# Patient Record
Sex: Female | Born: 1995
Health system: Southern US, Community
[De-identification: ages and names within clinical notes are randomized; demographics above are authoritative.]

## PROBLEM LIST (undated history)

## (undated) DIAGNOSIS — S42462A Displaced fracture of medial condyle of left humerus, initial encounter for closed fracture: Secondary | ICD-10-CM

---
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
## 2015-02-18 ENCOUNTER — Emergency Department (HOSPITAL_COMMUNITY): Payer: BLUE CROSS/BLUE SHIELD

## 2015-02-18 ENCOUNTER — Encounter (HOSPITAL_COMMUNITY): Payer: Self-pay

## 2015-02-18 ENCOUNTER — Emergency Department (INDEPENDENT_AMBULATORY_CARE_PROVIDER_SITE_OTHER): Payer: BLUE CROSS/BLUE SHIELD

## 2015-02-18 ENCOUNTER — Emergency Department (HOSPITAL_COMMUNITY)
Admission: EM | Admit: 2015-02-18 | Discharge: 2015-02-18 | Disposition: A | Discharge status: Home / Self Care | Payer: BLUE CROSS/BLUE SHIELD | Source: Home / Self Care | Attending: Family Medicine | Admitting: Family Medicine

## 2015-02-18 DIAGNOSIS — W19XXXA Unspecified fall, initial encounter: Secondary | ICD-10-CM

## 2015-02-18 DIAGNOSIS — S42442A Displaced fracture (avulsion) of medial epicondyle of left humerus, initial encounter for closed fracture: Secondary | ICD-10-CM

## 2015-02-18 DIAGNOSIS — S42462A Displaced fracture of medial condyle of left humerus, initial encounter for closed fracture: Secondary | ICD-10-CM

## 2015-02-18 HISTORY — DX: Displaced fracture of medial condyle of left humerus, initial encounter for closed fracture: S42.462A

## 2015-02-18 MED ORDER — IBUPROFEN 800 MG PO TABS
ORAL_TABLET | ORAL | Status: AC
  Filled 2015-02-18: qty 1

## 2015-02-18 MED ORDER — IBUPROFEN 800 MG PO TABS
800.0000 mg | ORAL_TABLET | Freq: Once | ORAL | Status: AC
  Administered 2015-02-18: 800 mg via ORAL

## 2015-02-18 NOTE — ED Notes (Addendum)
States she fell last PM, and after her fall, she "popped her elbow back into place". Ecchymosis noted, good ROM /sensation in fingers. Patient retained rings

## 2015-02-18 NOTE — ED Notes (Signed)
Ortho tech paged  

## 2015-02-18 NOTE — Progress Notes (Signed)
Orthopedic Tech Progress Note Patient Details:  Elizabeth CollumOlivia G Daugherty Jul 13, 1996 [id number]  Ortho Devices Type of Ortho Device: Ace wrap, Arm sling, Long arm splint Ortho Device/Splint Location: lue Ortho Device/Splint Interventions: Application Viewed order from doctor's order list  Nikki DomCrawford, [name] 02/18/2015, 11:20 AM

## 2015-02-18 NOTE — ED Provider Notes (Signed)
CSN: [id number]     Arrival date & time 02/18/15  0803 History   First MD Initiated Contact with Patient 02/18/15 [phone]     Chief Complaint  Patient presents with  . Fall   (Consider location/radiation/quality/duration/timing/severity/associated sxs/prior Treatment) HPI  Larey Seat yesterday while taking a self feet. Patient reports falling off the back of a couch hitting another couch and on the floor. Arm initially floppy but pt reports pulling on arm and putting it back in place. 200-400mg  ibuprofen and ice w/o much relief. I will became immediately tender and swollen pain and swelling are constant and getting worse. Decreased range of motion secondary to pain. Denies chest pain, palpitations, shortness of breath, headache, syncope, head trauma, abdominal pain, dysuria, frequency, loss of function of sensation or strength in left hand.   Past Medical History  Diagnosis Date  . Depression    Past Surgical History  Procedure Laterality Date  . Tonsillectomy    . Stitches on her head     History reviewed. No pertinent family history. History  Substance Use Topics  . Smoking status: Current Every Day Smoker -- 0.50 packs/day    Types: Cigarettes  . Smokeless tobacco: Never Used  . Alcohol Use: No   OB History    No data available     Review of Systems Per HPI with all other pertinent systems negative.   Allergies  Review of patient's allergies indicates no known allergies.  Home Medications   Prior to Admission medications   Medication Sig Start Date End Date Taking? Authorizing Provider  Norethindrone Acet-Ethinyl Est (JUNEL 1.5/30 PO) Take 1 tablet by mouth daily.   Yes Historical Provider, MD   BP 124/86 mmHg  Pulse 82  Temp(Src) 98.2 F (36.8 C) (Oral)  Resp 12  SpO2 98%  LMP 02/16/2015 (Exact Date) Physical Exam Physical Exam  Constitutional: oriented to person, place, and time. appears well-developed and well-nourished. No distress.  HENT:  Head: Normocephalic  and atraumatic.  Eyes: EOMI. PERRL.  Neck: Normal range of motion.  Cardiovascular: RRR, no m/r/g, 2+ distal pulses,  Pulmonary/Chest: Effort normal and breath sounds normal. No respiratory distress.  Abdominal: Soft. Bowel sounds are normal. NonTTP, no distension.  Musculoskeletal: Limited range of motion of left elbow secondary to pain and swelling. Left elbow with diffuse swelling and medial tenderness to palpation. Distal left upper extremity pulses intact. Neurological: alert and oriented to person, place, and time.  Skin: Skin is warm. No rash noted. non diaphoretic.  Psychiatric: normal mood and affect. behavior is normal. Judgment and thought content normal.   ED Course  Procedures (including critical care time) Labs Review Labs Reviewed - No data to display  Imaging Review Dg Elbow Complete Left  02/18/2015   CLINICAL DATA:  Patient fell onto elbow  EXAM: LEFT ELBOW - COMPLETE 3+ VIEW  COMPARISON:  None.  FINDINGS: Frontal, lateral, and bilateral oblique views were obtained. There is a displaced fracture of the distal medial humeral condyle. There is soft tissue swelling but no appreciable effusion. No dislocation. There is a amorphous calcification adjacent to the lateral distal humeral condyle, likely of arthropathic etiology.  IMPRESSION: Displaced fracture medial distal humeral condyle. Ill-defined calcification adjacent to the lateral distal humeral condyle of uncertain etiology. No dislocation.   Electronically Signed   By: Bretta Bang III M.D.   On: 02/18/2015 09:05     MDM   1. Displaced fracture of medial epicondyle of humerus, left, closed, initial encounter   2. Fall,  initial encounter    Discussed case with Dr. Carola FrostHandy of orthopedic surgery, who is graciously agreed to see the patient in his clinic today. Patient placed in a posterior splint at 90 and fitted with a sling. Motrin 800 mg by mouth given. Ice pack provided. Images of the patient's elbow provided.  Patient likely to need surgery to repair.  Precautions given and all questions answered   Shelly Flattenavid Taahir Grisby, MD Family Medicine 02/18/2015, 10:32 AM       Ozella Rocksavid J Jaaliyah Lucatero, MD 02/18/15 [phone]

## 2015-02-18 NOTE — ED Notes (Signed)
Family at bedside. 

## 2015-02-18 NOTE — ED Notes (Signed)
Discussed delay

## 2015-02-18 NOTE — ED Notes (Signed)
Patient is resting comfortably. 

## 2015-02-18 NOTE — Discharge Instructions (Signed)
You broke your elbow. Specifically you had caused a displaced fracture of the medial humeral epicondyle. I spoke to Dr Carola FrostHandy of orthopedic surgery and he wants you to call his office today to see him in the office. Please use ibuprofen 600 mg every 6 hours or 800 mg every 8 hours for pain and inflammation. Please ice your elbow regularly for the next 24 hours. You will likely need surgery to repair your elbow.

## 2015-02-19 ENCOUNTER — Other Ambulatory Visit: Payer: Self-pay | Admitting: Orthopedic Surgery

## 2015-02-19 ENCOUNTER — Ambulatory Visit
Admission: RE | Admit: 2015-02-19 | Discharge: 2015-02-19 | Disposition: A | Discharge status: Home / Self Care | Payer: BLUE CROSS/BLUE SHIELD | Source: Ambulatory Visit | Attending: Orthopedic Surgery | Admitting: Orthopedic Surgery

## 2015-02-19 DIAGNOSIS — S42442A Displaced fracture (avulsion) of medial epicondyle of left humerus, initial encounter for closed fracture: Secondary | ICD-10-CM

## 2015-02-23 ENCOUNTER — Other Ambulatory Visit: Payer: Self-pay | Admitting: Orthopedic Surgery

## 2015-02-24 ENCOUNTER — Encounter (HOSPITAL_BASED_OUTPATIENT_CLINIC_OR_DEPARTMENT_OTHER): Payer: Self-pay | Admitting: *Deleted

## 2015-03-02 ENCOUNTER — Encounter (HOSPITAL_BASED_OUTPATIENT_CLINIC_OR_DEPARTMENT_OTHER): Payer: Self-pay | Admitting: Anesthesiology

## 2015-03-02 ENCOUNTER — Ambulatory Visit (HOSPITAL_BASED_OUTPATIENT_CLINIC_OR_DEPARTMENT_OTHER): Payer: BLUE CROSS/BLUE SHIELD | Admitting: Certified Registered"

## 2015-03-02 ENCOUNTER — Encounter (HOSPITAL_BASED_OUTPATIENT_CLINIC_OR_DEPARTMENT_OTHER)
Admission: RE | Disposition: A | Discharge status: Home / Self Care | Payer: Self-pay | Source: Ambulatory Visit | Attending: Orthopedic Surgery

## 2015-03-02 ENCOUNTER — Ambulatory Visit (HOSPITAL_BASED_OUTPATIENT_CLINIC_OR_DEPARTMENT_OTHER)
Admission: RE | Admit: 2015-03-02 | Discharge: 2015-03-02 | Disposition: A | Discharge status: Home / Self Care | Payer: BLUE CROSS/BLUE SHIELD | Source: Ambulatory Visit | Attending: Orthopedic Surgery | Admitting: Orthopedic Surgery

## 2015-03-02 DIAGNOSIS — W07XXXA Fall from chair, initial encounter: Secondary | ICD-10-CM | POA: Diagnosis not present

## 2015-03-02 DIAGNOSIS — S42442A Displaced fracture (avulsion) of medial epicondyle of left humerus, initial encounter for closed fracture: Secondary | ICD-10-CM | POA: Diagnosis not present

## 2015-03-02 DIAGNOSIS — F1099 Alcohol use, unspecified with unspecified alcohol-induced disorder: Secondary | ICD-10-CM | POA: Diagnosis not present

## 2015-03-02 DIAGNOSIS — F17210 Nicotine dependence, cigarettes, uncomplicated: Secondary | ICD-10-CM | POA: Diagnosis not present

## 2015-03-02 HISTORY — DX: Displaced fracture of medial condyle of left humerus, initial encounter for closed fracture: S42.462A

## 2015-03-02 HISTORY — PX: ORIF ELBOW FRACTURE: SHX5031

## 2015-03-02 SURGERY — OPEN REDUCTION INTERNAL FIXATION (ORIF) ELBOW/OLECRANON FRACTURE
Anesthesia: General | Site: Elbow | Laterality: Left

## 2015-03-02 MED ORDER — CEFAZOLIN SODIUM-DEXTROSE 2-3 GM-% IV SOLR
2.0000 g | INTRAVENOUS | Status: AC
  Administered 2015-03-02: 2 g via INTRAVENOUS

## 2015-03-02 MED ORDER — IBUPROFEN 200 MG PO TABS: 200.0000 mg | ORAL_TABLET | Freq: Four times a day (QID) | ORAL | Status: DC | PRN

## 2015-03-02 MED ORDER — FENTANYL CITRATE (PF) 100 MCG/2ML IJ SOLN
INTRAMUSCULAR | Status: AC
Start: 2015-03-02 — End: 2015-03-02
  Filled 2015-03-02: qty 2

## 2015-03-02 MED ORDER — DEXAMETHASONE SODIUM PHOSPHATE 10 MG/ML IJ SOLN
INTRAMUSCULAR | Status: DC | PRN
  Administered 2015-03-02: 10 mg via INTRAVENOUS

## 2015-03-02 MED ORDER — LACTATED RINGERS IV SOLN
INTRAVENOUS | Status: DC
  Administered 2015-03-02 (×2): via INTRAVENOUS

## 2015-03-02 MED ORDER — FENTANYL CITRATE (PF) 100 MCG/2ML IJ SOLN
INTRAMUSCULAR | Status: AC
  Filled 2015-03-02: qty 6

## 2015-03-02 MED ORDER — LIDOCAINE HCL (CARDIAC) 20 MG/ML IV SOLN
INTRAVENOUS | Status: DC | PRN
  Administered 2015-03-02: 50 mg via INTRAVENOUS

## 2015-03-02 MED ORDER — DOCUSATE SODIUM 100 MG PO CAPS: 100.0000 mg | ORAL_CAPSULE | Freq: Three times a day (TID) | ORAL | Status: DC | PRN

## 2015-03-02 MED ORDER — MIDAZOLAM HCL 2 MG/ML PO SYRP: 12.0000 mg | ORAL_SOLUTION | Freq: Once | ORAL | Status: DC | PRN

## 2015-03-02 MED ORDER — ONDANSETRON HCL 4 MG/2ML IJ SOLN
INTRAMUSCULAR | Status: DC | PRN
  Administered 2015-03-02: 4 mg via INTRAVENOUS

## 2015-03-02 MED ORDER — POVIDONE-IODINE 7.5 % EX SOLN: Freq: Once | CUTANEOUS | Status: DC

## 2015-03-02 MED ORDER — OXYCODONE HCL 5 MG PO TABS
5.0000 mg | ORAL_TABLET | Freq: Once | ORAL | Status: AC
  Administered 2015-03-02: 5 mg via ORAL

## 2015-03-02 MED ORDER — MIDAZOLAM HCL 2 MG/2ML IJ SOLN
INTRAMUSCULAR | Status: AC
  Filled 2015-03-02: qty 2

## 2015-03-02 MED ORDER — OXYCODONE HCL 5 MG PO TABS
ORAL_TABLET | ORAL | Status: AC
  Filled 2015-03-02: qty 1

## 2015-03-02 MED ORDER — PROPOFOL 10 MG/ML IV BOLUS
INTRAVENOUS | Status: DC | PRN
  Administered 2015-03-02: 200 mg via INTRAVENOUS

## 2015-03-02 MED ORDER — CEFAZOLIN SODIUM-DEXTROSE 2-3 GM-% IV SOLR
INTRAVENOUS | Status: AC
  Filled 2015-03-02: qty 50

## 2015-03-02 MED ORDER — KETOROLAC TROMETHAMINE 30 MG/ML IJ SOLN
30.0000 mg | Freq: Once | INTRAMUSCULAR | Status: DC | PRN
Start: 2015-03-02 — End: 2015-03-02

## 2015-03-02 MED ORDER — MIDAZOLAM HCL 2 MG/2ML IJ SOLN
1.0000 mg | INTRAMUSCULAR | Status: DC | PRN
  Administered 2015-03-02: 2 mg via INTRAVENOUS

## 2015-03-02 MED ORDER — IBUPROFEN 100 MG/5ML PO SUSP
200.0000 mg | Freq: Four times a day (QID) | ORAL | Status: DC | PRN
Start: 2015-03-02 — End: 2015-03-02

## 2015-03-02 MED ORDER — HYDROMORPHONE HCL 1 MG/ML IJ SOLN: 0.2500 mg | INTRAMUSCULAR | Status: DC | PRN

## 2015-03-02 MED ORDER — OXYCODONE-ACETAMINOPHEN 5-325 MG PO TABS: 1.0000 | ORAL_TABLET | ORAL | Status: DC | PRN

## 2015-03-02 MED ORDER — BUPIVACAINE-EPINEPHRINE (PF) 0.5% -1:200000 IJ SOLN
INTRAMUSCULAR | Status: DC | PRN
  Administered 2015-03-02: 25 mL via PERINEURAL

## 2015-03-02 MED ORDER — MEPERIDINE HCL 25 MG/ML IJ SOLN: 6.2500 mg | INTRAMUSCULAR | Status: DC | PRN

## 2015-03-02 MED ORDER — FENTANYL CITRATE 0.05 MG/ML IJ SOLN
50.0000 ug | INTRAMUSCULAR | Status: DC | PRN
  Administered 2015-03-02: 100 ug via INTRAVENOUS
  Administered 2015-03-02 (×2): 25 ug via INTRAVENOUS
  Administered 2015-03-02: 50 ug via INTRAVENOUS

## 2015-03-02 SURGICAL SUPPLY — 67 items
BANDAGE ELASTIC 4 VELCRO ST LF (GAUZE/BANDAGES/DRESSINGS) ×3 IMPLANT
BLADE SURG 15 STRL LF DISP TIS (BLADE) ×1 IMPLANT
BLADE SURG 15 STRL SS (BLADE) ×2
BNDG COHESIVE 4X5 TAN STRL (GAUZE/BANDAGES/DRESSINGS) ×3 IMPLANT
BNDG ESMARK 4X9 LF (GAUZE/BANDAGES/DRESSINGS) ×3 IMPLANT
BNDG GAUZE ELAST 4 BULKY (GAUZE/BANDAGES/DRESSINGS) ×3 IMPLANT
CANISTER SUCT 1200ML W/VALVE (MISCELLANEOUS) ×3 IMPLANT
CHLORAPREP W/TINT 26ML (MISCELLANEOUS) ×3 IMPLANT
CORDS BIPOLAR (ELECTRODE) IMPLANT
COVER BACK TABLE 60X90IN (DRAPES) ×3 IMPLANT
COVER MAYO STAND STRL (DRAPES) ×3 IMPLANT
CUFF TOURN SGL LL 18 NRW (TOURNIQUET CUFF) ×3 IMPLANT
DECANTER SPIKE VIAL GLASS SM (MISCELLANEOUS) IMPLANT
DRAPE EXTREMITY T 121X128X90 (DRAPE) ×3 IMPLANT
DRAPE SURG 17X23 STRL (DRAPES) ×3 IMPLANT
DRAPE U 20/CS (DRAPES) ×3 IMPLANT
DRAPE U-SHAPE 47X51 STRL (DRAPES) ×3 IMPLANT
DRSG EMULSION OIL 3X3 NADH (GAUZE/BANDAGES/DRESSINGS) ×3 IMPLANT
ELECT REM PT RETURN 9FT ADLT (ELECTROSURGICAL) ×3
ELECTRODE REM PT RTRN 9FT ADLT (ELECTROSURGICAL) ×1 IMPLANT
GAUZE SPONGE 4X4 12PLY STRL (GAUZE/BANDAGES/DRESSINGS) ×3 IMPLANT
GLOVE BIO SURGEON STRL SZ7 (GLOVE) ×3 IMPLANT
GLOVE BIOGEL PI IND STRL 7.0 (GLOVE) ×1 IMPLANT
GLOVE BIOGEL PI IND STRL 8 (GLOVE) ×1 IMPLANT
GLOVE BIOGEL PI INDICATOR 7.0 (GLOVE) ×2
GLOVE BIOGEL PI INDICATOR 8 (GLOVE) ×2
GLOVE SURG SS PI 7.0 STRL IVOR (GLOVE) ×3 IMPLANT
GOWN STRL REUS W/ TWL LRG LVL3 (GOWN DISPOSABLE) ×2 IMPLANT
GOWN STRL REUS W/ TWL XL LVL3 (GOWN DISPOSABLE) ×1 IMPLANT
GOWN STRL REUS W/TWL LRG LVL3 (GOWN DISPOSABLE) ×4
GOWN STRL REUS W/TWL XL LVL3 (GOWN DISPOSABLE) ×2
K-WIRE ACE 1.6X6 (WIRE) ×3
KWIRE ACE 1.6X6 (WIRE) ×1 IMPLANT
NEEDLE HYPO 25X1 1.5 SAFETY (NEEDLE) IMPLANT
NS IRRIG 1000ML POUR BTL (IV SOLUTION) ×3 IMPLANT
PACK BASIN DAY SURGERY FS (CUSTOM PROCEDURE TRAY) ×3 IMPLANT
PAD CAST 4YDX4 CTTN HI CHSV (CAST SUPPLIES) ×1 IMPLANT
PADDING CAST ABS 4INX4YD NS (CAST SUPPLIES) ×2
PADDING CAST ABS COTTON 4X4 ST (CAST SUPPLIES) ×1 IMPLANT
PADDING CAST COTTON 4X4 STRL (CAST SUPPLIES) ×2
PENCIL BUTTON HOLSTER BLD 10FT (ELECTRODE) ×3 IMPLANT
PLATE SPIDER 16 (Washer) ×3 IMPLANT
SCREW ACE CAN 4.0 46M (Screw) ×3 IMPLANT
SLING ARM FOAM STRAP LRG (SOFTGOODS) ×3 IMPLANT
SPLINT FAST PLASTER 5X30 (CAST SUPPLIES)
SPLINT PLASTER CAST FAST 5X30 (CAST SUPPLIES) IMPLANT
SPLINT PLASTER CAST XFAST 3X15 (CAST SUPPLIES) IMPLANT
SPLINT PLASTER CAST XFAST 4X15 (CAST SUPPLIES) IMPLANT
SPLINT PLASTER XTRA FAST SET 4 (CAST SUPPLIES)
SPLINT PLASTER XTRA FASTSET 3X (CAST SUPPLIES)
SPONGE LAP 4X18 X RAY DECT (DISPOSABLE) ×6 IMPLANT
STOCKINETTE 4X48 STRL (DRAPES) ×3 IMPLANT
SUCTION FRAZIER TIP 10 FR DISP (SUCTIONS) ×3 IMPLANT
SUT ETHILON 4 0 PS 2 18 (SUTURE) ×3 IMPLANT
SUT MNCRL AB 4-0 PS2 18 (SUTURE) ×3 IMPLANT
SUT TIGER TAPE 7 IN WHITE (SUTURE) IMPLANT
SUT VIC AB 2-0 SH 27 (SUTURE) ×2
SUT VIC AB 2-0 SH 27XBRD (SUTURE) ×1 IMPLANT
SYR BULB 3OZ (MISCELLANEOUS) ×3 IMPLANT
SYR CONTROL 10ML LL (SYRINGE) IMPLANT
TAPE FIBER 2MM 7IN #2 BLUE (SUTURE) IMPLANT
TAPE STRIPS DRAPE STRL (GAUZE/BANDAGES/DRESSINGS) ×3 IMPLANT
TOWEL OR 17X24 6PK STRL BLUE (TOWEL DISPOSABLE) ×6 IMPLANT
TOWEL OR NON WOVEN STRL DISP B (DISPOSABLE) ×3 IMPLANT
TUBE CONNECTING 20'X1/4 (TUBING) ×1
TUBE CONNECTING 20X1/4 (TUBING) ×2 IMPLANT
UNDERPAD 30X30 INCONTINENT (UNDERPADS AND DIAPERS) ×3 IMPLANT

## 2015-03-02 NOTE — Progress Notes (Signed)
Assisted Dr. Crews with left, ultrasound guided, interscalene  block. Side rails up, monitors on throughout procedure. See vital signs in flow sheet. Tolerated Procedure well. 

## 2015-03-02 NOTE — Discharge Instructions (Signed)
Discharge Instructions after Open Elbow Surgery   A sling may be provided for you. If so, remain in your sling at all times. This includes sleeping in the sling.  Use ice on the elbow intermittently over the first 48 hours after surgery. Pain medicine has been prescribed for you.  Use your medicine liberally over the first 48 hours, and then you can begin to taper your use. You may take Extra Strength Tylenol or Tylenol only in place of the pain pills.  Leave the dressing in place until your follow-up appointment  You may shower after surgery. The dressing CANNOT get wet during your shower. Wrap the dressing with a dry towel and place your arm in a large trash bag.  Take one aspirin a day for 2 weeks after surgery, unless you have an aspirin sensitivity/ allergy or asthma.   Please call [phone] during normal business hours or [phone] after hours for any problems. Including the following:  - excessive redness of the incisions - drainage for more than 4 days - fever of more than 101.5 F  *Please note that pain medications will not be refilled after hours or on weekends.      Post Anesthesia Home Care Instructions  Activity: Get plenty of rest for the remainder of the day. A responsible adult should stay with you for 24 hours following the procedure.  For the next 24 hours, DO NOT: -Drive a car -Advertising copywriter -Drink alcoholic beverages -Take any medication unless instructed by your physician -Make any legal decisions or sign important papers.  Meals: Start with liquid foods such as gelatin or soup. Progress to regular foods as tolerated. Avoid greasy, spicy, heavy foods. If nausea and/or vomiting occur, drink only clear liquids until the nausea and/or vomiting subsides. Call your physician if vomiting continues.  Special Instructions/Symptoms: Your throat may feel dry or sore from the anesthesia or the breathing tube placed in your throat during surgery. If this  causes discomfort, gargle with warm salt water. The discomfort should disappear within 24 hours.  If you had a scopolamine patch placed behind your ear for the management of post- operative nausea and/or vomiting:  1. The medication in the patch is effective for 72 hours, after which it should be removed.  Wrap patch in a tissue and discard in the trash. Wash hands thoroughly with soap and water. 2. You may remove the patch earlier than 72 hours if you experience unpleasant side effects which may include dry mouth, dizziness or visual disturbances. 3. Avoid touching the patch. Wash your hands with soap and water after contact with the patch.    Regional Anesthesia Blocks  1. Numbness or the inability to move the "blocked" extremity may last from 3-48 hours after placement. The length of time depends on the medication injected and your individual response to the medication. If the numbness is not going away after 48 hours, call your surgeon.  2. The extremity that is blocked will need to be protected until the numbness is gone and the  Strength has returned. Because you cannot feel it, you will need to take extra care to avoid injury. Because it may be weak, you may have difficulty moving it or using it. You may not know what position it is in without looking at it while the block is in effect.  3. For blocks in the legs and feet, returning to weight bearing and walking needs to be done carefully. You will need to wait until the numbness  is entirely gone and the strength has returned. You should be able to move your leg and foot normally before you try and bear weight or walk. You will need someone to be with you when you first try to ensure you do not fall and possibly risk injury.  4. Bruising and tenderness at the needle site are common side effects and will resolve in a few days.  5. Persistent numbness or new problems with movement should be communicated to the surgeon or the [facility] [phone])/ [facility] [phone]).

## 2015-03-02 NOTE — Anesthesia Procedure Notes (Addendum)
Procedure Name: LMA Insertion Date/Time: 03/02/2015 12:49 PM Performed by: Caren MacadamARTER, APRIL W Pre-anesthesia Checklist: Patient identified, Emergency Drugs available, Suction available and Patient being monitored Patient Re-evaluated:Patient Re-evaluated prior to inductionOxygen Delivery Method: Circle System Utilized Preoxygenation: Pre-oxygenation with 100% oxygen Intubation Type: IV induction Ventilation: Mask ventilation without difficulty LMA: LMA inserted LMA Size: 4.0 Number of attempts: 1 Airway Equipment and Method: Bite block Placement Confirmation: positive ETCO2 and breath sounds checked- equal and bilateral Tube secured with: Tape Dental Injury: Teeth and Oropharynx as per pre-operative assessment    Anesthesia Regional Block:  Supraclavicular block  Pre-Anesthetic Checklist: ,, timeout performed, Correct Patient, Correct Site, Correct Laterality, Correct Procedure, Correct Position, site marked, Risks and benefits discussed,  Surgical consent,  Pre-op evaluation,  At surgeon's request and post-op pain management  Laterality: Left and Upper  Prep: chloraprep       Needles:  Injection technique: Single-shot  Needle Type: Echogenic Stimulator Needle     Needle Length: 5cm 5 cm Needle Gauge: 21 and 21 G    Additional Needles:  Procedures: ultrasound guided (picture in chart) Supraclavicular block Narrative:  Start time: 03/02/2015 12:27 PM End time: 03/02/2015 12:32 PM Injection made incrementally with aspirations every 5 mL.  Performed by: Personally  Anesthesiologist: Seann Genther

## 2015-03-02 NOTE — Anesthesia Postprocedure Evaluation (Signed)
  Anesthesia Post-op Note  Patient: Elizabeth Daugherty  Procedure(s) Performed: Procedure(s) with comments: OPEN REDUCTION INTERNAL FIXATION (ORIF) ELBOW MEDIAL EPICONDYLE FRACTURE (Left) - Open reduction internal fixation left elbow medial epicondyle fracture  Patient Location: PACU  Anesthesia Type: General with regional block for post op pain   Level of Consciousness: awake, alert  and oriented  Airway and Oxygen Therapy: Patient Spontanous Breathing  Post-op Pain: mild  Post-op Assessment: Post-op Vital signs reviewed  Post-op Vital Signs: Reviewed  Last Vitals:  Filed Vitals:   03/02/15 1500  BP: 139/76  Pulse: 88  Temp:   Resp: 19    Complications: No apparent anesthesia complications

## 2015-03-02 NOTE — H&P (Signed)
Elizabeth Daugherty is an 19 y.o. female.   Chief Complaint: L elbow fracture HPI: s/p fall from couch with L elbow fracture dislocation.  Past Medical History  Diagnosis Date  . Fracture of elbow, medial condyle, left, closed 02/18/2015    medial epicondyle fx.    Past Surgical History  Procedure Laterality Date  . Tonsillectomy  10/30/2013    History reviewed. No pertinent family history. Social History:  reports that she has been smoking Cigarettes.  She has a 1.5 pack-year smoking history. She has never used smokeless tobacco. She reports that she drinks alcohol. She reports that she does not use illicit drugs.  Allergies: No Known Allergies  Medications Prior to Admission  Medication Sig Dispense Refill  . Norethindrone Acet-Ethinyl Est (JUNEL 1.5/30 PO) Take 1 tablet by mouth daily.    . traMADol (ULTRAM) 50 MG tablet Take 50 mg by mouth every 6 (six) hours as needed.    Marland Kitchen. HYDROcodone-acetaminophen (NORCO/VICODIN) 5-325 MG per tablet Take 1 tablet by mouth every 6 (six) hours as needed for moderate pain.      No results found for this or any previous visit (from the past 48 hour(s)). No results found.  Review of Systems  All other systems reviewed and are negative.   Blood pressure 139/79, pulse 94, temperature 98.5 F (36.9 C), temperature source Oral, resp. rate 20, height 5' 9.5" (1.765 m), weight 91.627 kg (202 lb), last menstrual period 02/16/2015, SpO2 99 %. Physical Exam  Constitutional: She is oriented to person, place, and time. She appears well-developed and well-nourished.  HENT:  Head: Atraumatic.  Eyes: EOM are normal.  Cardiovascular: Intact distal pulses.   Respiratory: Effort normal.  Musculoskeletal:  L elbow splinted , NVID  Neurological: She is alert and oriented to person, place, and time.  Skin: Skin is warm and dry.  Psychiatric: She has a normal mood and affect.     Assessment/Plan L elbow medial epicondyle fracture Plan ORIF Risks / benefits  of surgery discussed Consent on chart  NPO for OR Preop antibiotics    Gwenivere Hiraldo WILLIAM 03/02/2015, 12:17 PM

## 2015-03-02 NOTE — Anesthesia Preprocedure Evaluation (Signed)
Anesthesia Evaluation  Patient identified by MRN, date of birth, ID band Patient awake    Reviewed: Allergy & Precautions, NPO status , Patient's Chart, lab work & pertinent test results  Airway Mallampati: I  TM Distance: >3 FB Neck ROM: Full    Dental  (+) Teeth Intact, Dental Advisory Given   Pulmonary Current Smoker,  breath sounds clear to auscultation        Cardiovascular Rhythm:Regular Rate:Normal     Neuro/Psych    GI/Hepatic   Endo/Other    Renal/GU      Musculoskeletal   Abdominal   Peds  Hematology   Anesthesia Other Findings   Reproductive/Obstetrics                             Anesthesia Physical Anesthesia Plan  ASA: I  Anesthesia Plan: General   Post-op Pain Management:    Induction: Intravenous  Airway Management Planned: LMA  Additional Equipment:   Intra-op Plan:   Post-operative Plan: Extubation in OR  Informed Consent: I have reviewed the patients History and Physical, chart, labs and discussed the procedure including the risks, benefits and alternatives for the proposed anesthesia with the patient or authorized representative who has indicated his/her understanding and acceptance.   Dental advisory given  Plan Discussed with: CRNA, Anesthesiologist and Surgeon  Anesthesia Plan Comments:         Anesthesia Quick Evaluation  

## 2015-03-02 NOTE — Transfer of Care (Signed)
Immediate Anesthesia Transfer of Care Note  Patient: Elizabeth Daugherty  Procedure(s) Performed: Procedure(s) with comments: OPEN REDUCTION INTERNAL FIXATION (ORIF) ELBOW MEDIAL EPICONDYLE FRACTURE (Left) - Open reduction internal fixation left elbow medial epicondyle fracture  Patient Location: PACU  Anesthesia Type:GA combined with regional for post-op pain  Level of Consciousness: awake, alert , oriented and patient cooperative  Airway & Oxygen Therapy: Patient Spontanous Breathing and Patient connected to face mask oxygen  Post-op Assessment: Report given to RN and Post -op Vital signs reviewed and stable  Post vital signs: Reviewed and stable  Last Vitals:  Filed Vitals:   03/02/15 1245  BP: 135/88  Pulse:   Temp:   Resp: 25    Complications: No apparent anesthesia complications

## 2015-03-02 NOTE — Op Note (Signed)
Procedure(s): OPEN REDUCTION INTERNAL FIXATION (ORIF) ELBOW MEDIAL EPICONDYLE FRACTURE Procedure Note  Elizabeth CollumOlivia G Daugherty female 19 y.o. 03/02/2015  Procedure(s) and Anesthesia Type:    * OPEN REDUCTION INTERNAL FIXATION (ORIF) ELBOW MEDIAL EPICONDYLE FRACTURE - General  Surgeon(s) and Role:    * Jones BroomJustin Jayonna Meyering, MD - Primary   Indications:  19 y.o. female s/p fall with left comminuted and displaced medial epicondyle fracture.   Surgeon: Mable ParisHANDLER,Minola Guin WILLIAM   Assistants: Damita Lackanielle Lalibert PA-C [facility](Danielle was present and scrubbed throughout the procedure and was essential in positioning, retraction, exposure, and closure)  Anesthesia: General endotracheal anesthesia with preoperative regional block given by the attending anesthesiologist    Procedure Detail  OPEN REDUCTION INTERNAL FIXATION (ORIF) ELBOW MEDIAL EPICONDYLE FRACTURE  Findings: Excellent fixation with 4-0 cannulated screw and spider washer  Estimated Blood Loss:  less than 50 mL         Drains: none  Blood Given: none         Specimens: none        Complications:  * No complications entered in OR log *         Disposition: PACU - hemodynamically stable.         Condition: stable    Procedure:   DESCRIPTION OF PROCEDURE: The patient was identified in preoperative  holding area where I personally marked the operative site after  verifying site, side, and procedure with the patient. The patient was taken back  to the operating room where general anesthesia was induced without  complication  She was kept in the supine position with the operative extremity laid on a hand table. A nonsterile tourniquet was applied to the upper arm. After sterile prepping and draping examination under anesthesia demonstrated stiffness but no gross instability. A gentle manipulation was carried out to regain near full motion. This point limb was exsanguinated using an Esmarch dressing and the tourniquet was elevated to 250  mmHg. Approximate 6 cm incision was made in a curvilinear fashion over the medial condyle. Careful dissection was made down through subcutaneous tissues through healing scar tissue to the medial epicondyle. There was an effort made to keep the medial antebrachial cutaneous nerves intact which were protected throughout the procedure. The ulnar nerve was carefully dissected identifying and distally and then tracing proximally past the fracture site. It was closely approximated with the posterior aspect of the fracture and flexor pronator mass. It was also carefully protected throughout the procedure. After cleaning out the fracture bed one small anterior fragment was really not attached to any soft tissues and discarded. The larger fragment which was in the origin of the flexor pronator was pinned in place with a guidepin from the 4-0 cannulated screw set and after verifying fluoroscopic positioning a 46 mm partially threaded 4-0 cannulated screw was placed with a spider washer which brought the flexor pronator origin and bony fragments nicely down to the bony bed. Good compression was achieved. Fluoroscopic imaging demonstrated excellent positioning of the hardware and anatomic reduction. Copious irrigation was used and the wound was closed in layers with 2-0 Vicryl and 4-0 Monocryl. Steri-Strips were applied as well as a light sterile dressing. The patient was then placed in a long-arm splint had an at about 90 of flexion, allowed to awaken from anesthesia transferred to stretcher during the recovery room in stable condition. Tourniquet was let down for total tourniquet time approximately 90 minutes at 250 mmHg.  Postoperative plan: Patient will be discharged home today with her mother.  She will follow-up with me in my office in approximately one week at which point we will start early motion in a protected fashion.

## 2015-03-03 ENCOUNTER — Encounter (HOSPITAL_BASED_OUTPATIENT_CLINIC_OR_DEPARTMENT_OTHER): Payer: Self-pay | Admitting: Orthopedic Surgery

## 2016-08-07 IMAGING — CT CT ELBOW*L* W/O CM
3 of 4 series · 7 of 14 positions shown, 8 images · non-contrast
Comparison: Radiographs 02/18/2015.

CLINICAL DATA: Elbow dislocation 2 days prior. Evaluate closed
fracture of the medial epicondyle. Initial encounter.

EXAM:
CT OF THE LEFT ELBOW WITHOUT CONTRAST
TECHNIQUE: Multidetector CT imaging was performed according to the standard
protocol. Multiplanar CT image reconstructions were also generated.

[Series 201: cor bone · axial · 0.27mm/px · z∈[+2,+81]mm · 3 of 47 slices shown, 4 images]
[im 1/47  soft-tissue]
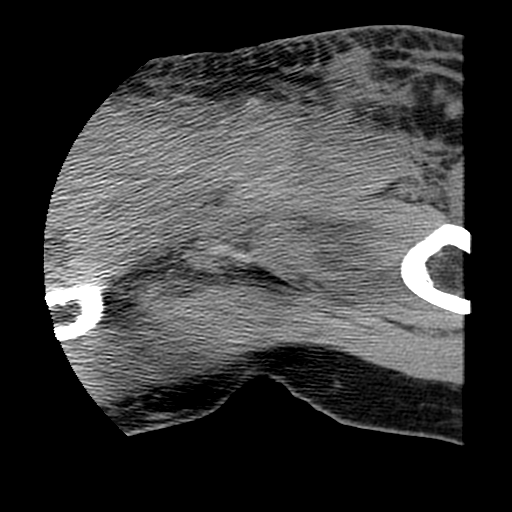
[im 1/47  bone]
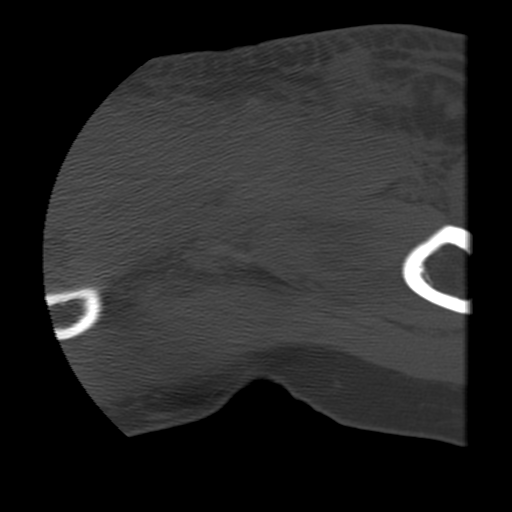
[im 24/47  bone]
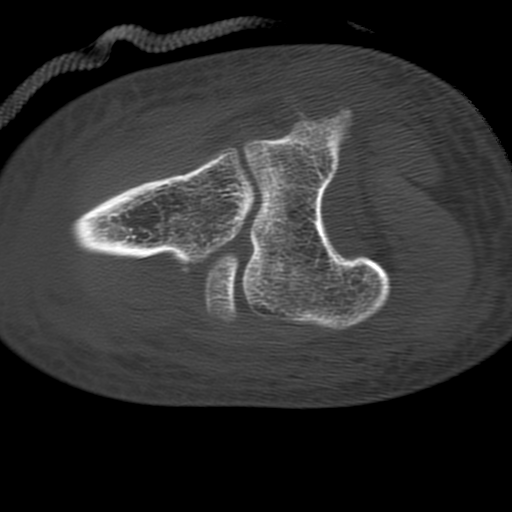
[im 47/47  bone]
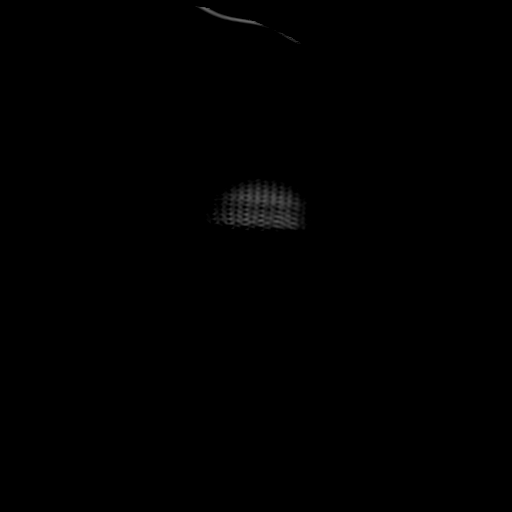

[Series 300: sag soft · coronal · 0.27mm/px · 2 of 58 slices shown]
[im 20/58  soft-tissue]
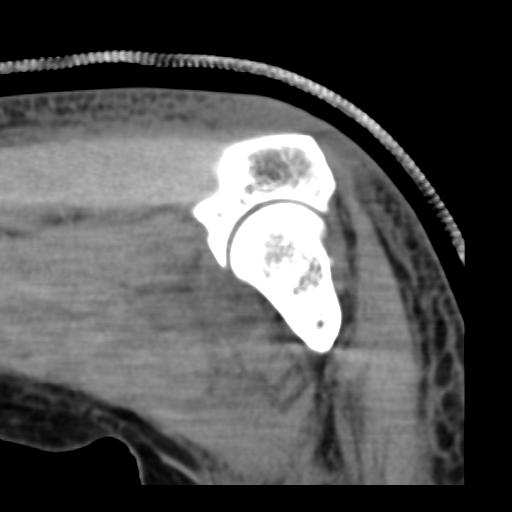
[im 39/58  soft-tissue]
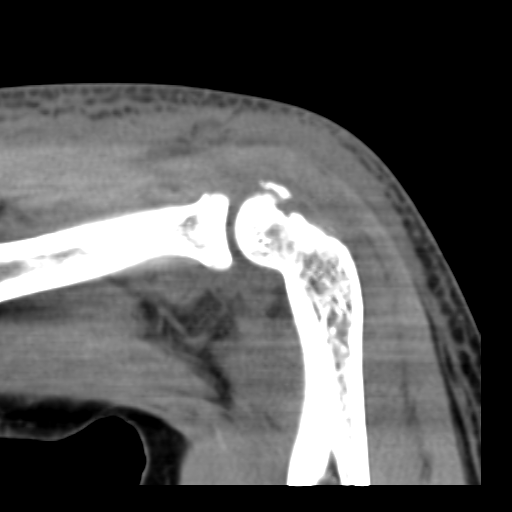

[Series 301: cor soft · axial · 0.27mm/px · z∈[+27,+60]mm · 2 of 55 slices shown]
[im 19/55  soft-tissue]
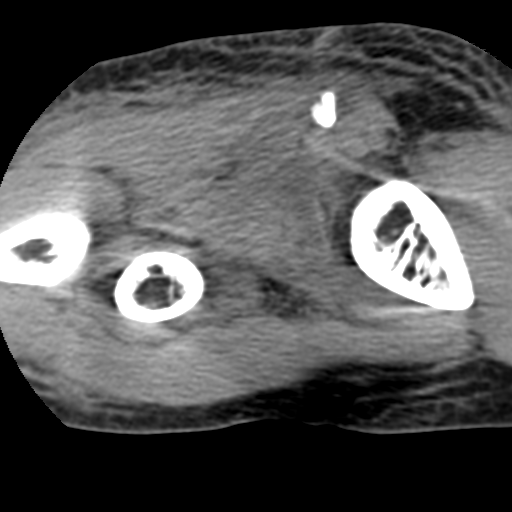
[im 37/55  soft-tissue]
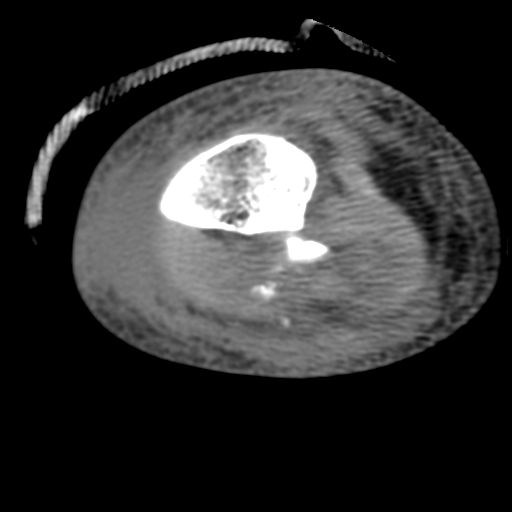

[7 of 14 positions shown; findings below may reference images not displayed]

FINDINGS: The elbow is splinted in 90 degrees. There is moderate diffuse soft
tissue edema surrounding the elbow, especially within the
subcutaneous fat. There is only a small elbow joint effusion. No
dislocation is demonstrated.

There is an extensively comminuted and displaced fracture of the
medial humeral epicondyle. These displaced avulsion fractures
measure up to 12 mm in diameter and appear contiguous with the
common flexor tendon.

There is a mildly comminuted and displaced fracture of the lateral
humeral condyle which extends distally to involve the posterior
aspect of the capitellum. There is no definite involvement of the
articular surface, although small fracture fragments are interposed
between the radial head and capitellum.

There is a nondisplaced fracture of the radial neck laterally. The
radial head articular surface is intact.

There is a mildly displaced fracture involving the supinator crest
of the ulna, likely an avulsion fracture mediated via the lateral
ulnar collateral ligament. The coronoid process is intact.
IMPRESSION: 1. Extensively comminuted and displaced avulsion fracture of the
medial humeral epicondyle.
2. Mildly comminuted impaction fracture of the posterior aspect of
the lateral humeral condyle and capitellum without definite
involvement of the articular surface.
3. Nondisplaced fracture of the radial neck laterally.
4. Mildly displaced avulsion fracture of the supinator crest of the
ulna, likely mediated via the lateral ulnar collateral ligament.

## 2017-04-05 ENCOUNTER — Other Ambulatory Visit: Payer: Self-pay | Admitting: Obstetrics & Gynecology

## 2017-04-05 ENCOUNTER — Other Ambulatory Visit (HOSPITAL_COMMUNITY)
Admission: RE | Admit: 2017-04-05 | Discharge: 2017-04-05 | Disposition: A | Discharge status: Home / Self Care | Payer: PRIVATE HEALTH INSURANCE | Source: Ambulatory Visit | Attending: Obstetrics & Gynecology | Admitting: Obstetrics & Gynecology

## 2017-04-05 DIAGNOSIS — Z124 Encounter for screening for malignant neoplasm of cervix: Secondary | ICD-10-CM | POA: Insufficient documentation

## 2017-04-11 LAB — CYTOLOGY - PAP
Chlamydia: NEGATIVE
Diagnosis: NEGATIVE
Neisseria Gonorrhea: NEGATIVE

## 2019-11-13 ENCOUNTER — Ambulatory Visit: Payer: BLUE CROSS/BLUE SHIELD | Attending: Internal Medicine

## 2019-11-13 DIAGNOSIS — Z20822 Contact with and (suspected) exposure to covid-19: Secondary | ICD-10-CM

## 2019-11-15 LAB — NOVEL CORONAVIRUS, NAA: SARS-CoV-2, NAA: NOT DETECTED

## 2021-04-24 ENCOUNTER — Ambulatory Visit (HOSPITAL_COMMUNITY)
Admission: RE | Admit: 2021-04-24 | Discharge: 2021-04-24 | Disposition: A | Discharge status: Home / Self Care | Payer: Commercial Managed Care - PPO | Attending: Psychiatry | Admitting: Psychiatry

## 2021-04-24 DIAGNOSIS — F331 Major depressive disorder, recurrent, moderate: Secondary | ICD-10-CM | POA: Insufficient documentation

## 2021-04-24 DIAGNOSIS — F1020 Alcohol dependence, uncomplicated: Secondary | ICD-10-CM | POA: Insufficient documentation
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
## 2021-06-11 ENCOUNTER — Other Ambulatory Visit: Payer: Self-pay

## 2021-06-11 ENCOUNTER — Ambulatory Visit (INDEPENDENT_AMBULATORY_CARE_PROVIDER_SITE_OTHER)
Admission: EM | Admit: 2021-06-11 | Discharge: 2021-06-12 | Disposition: A | Discharge status: Home / Self Care | Payer: Commercial Managed Care - PPO | Source: Home / Self Care

## 2021-06-11 DIAGNOSIS — F1010 Alcohol abuse, uncomplicated: Secondary | ICD-10-CM | POA: Insufficient documentation

## 2021-06-11 DIAGNOSIS — F17210 Nicotine dependence, cigarettes, uncomplicated: Secondary | ICD-10-CM | POA: Insufficient documentation

## 2021-06-11 DIAGNOSIS — Z9151 Personal history of suicidal behavior: Secondary | ICD-10-CM | POA: Insufficient documentation

## 2021-06-11 DIAGNOSIS — R45851 Suicidal ideations: Secondary | ICD-10-CM | POA: Diagnosis not present

## 2021-06-11 DIAGNOSIS — F332 Major depressive disorder, recurrent severe without psychotic features: Secondary | ICD-10-CM

## 2021-06-11 DIAGNOSIS — Z20822 Contact with and (suspected) exposure to covid-19: Secondary | ICD-10-CM | POA: Insufficient documentation

## 2021-06-11 DIAGNOSIS — R000 Tachycardia, unspecified: Secondary | ICD-10-CM | POA: Insufficient documentation

## 2021-06-11 DIAGNOSIS — F1290 Cannabis use, unspecified, uncomplicated: Secondary | ICD-10-CM | POA: Insufficient documentation

## 2021-06-11 LAB — POC SARS CORONAVIRUS 2 AG -  ED: SARS Coronavirus 2 Ag: NEGATIVE

## 2021-06-11 MED ORDER — ACETAMINOPHEN 325 MG PO TABS: 650.0000 mg | ORAL_TABLET | Freq: Four times a day (QID) | ORAL | Status: DC | PRN

## 2021-06-11 MED ORDER — HYDROXYZINE HCL 25 MG PO TABS: 25.0000 mg | ORAL_TABLET | Freq: Four times a day (QID) | ORAL | Status: DC | PRN

## 2021-06-11 MED ORDER — THIAMINE HCL 100 MG PO TABS: 100.0000 mg | ORAL_TABLET | Freq: Every day | ORAL | Status: DC

## 2021-06-11 MED ORDER — ALUM & MAG HYDROXIDE-SIMETH 200-200-20 MG/5ML PO SUSP: 30.0000 mL | ORAL | Status: DC | PRN

## 2021-06-11 MED ORDER — FLUOXETINE HCL 20 MG PO CAPS
20.0000 mg | ORAL_CAPSULE | Freq: Every day | ORAL | Status: DC
  Administered 2021-06-11: 20 mg via ORAL
  Filled 2021-06-11: qty 1

## 2021-06-11 MED ORDER — THIAMINE HCL 100 MG/ML IJ SOLN
100.0000 mg | Freq: Once | INTRAMUSCULAR | Status: AC
  Administered 2021-06-11: 100 mg via INTRAMUSCULAR
  Filled 2021-06-11: qty 2

## 2021-06-11 MED ORDER — ADULT MULTIVITAMIN W/MINERALS CH
1.0000 | ORAL_TABLET | Freq: Every day | ORAL | Status: DC
Start: 2021-06-11 — End: 2021-06-12
  Administered 2021-06-11: 1 via ORAL
  Filled 2021-06-11: qty 1

## 2021-06-11 MED ORDER — LOPERAMIDE HCL 2 MG PO CAPS: 2.0000 mg | ORAL_CAPSULE | ORAL | Status: DC | PRN

## 2021-06-11 MED ORDER — NICOTINE 14 MG/24HR TD PT24: 14.0000 mg | MEDICATED_PATCH | Freq: Every day | TRANSDERMAL | Status: DC

## 2021-06-11 MED ORDER — ONDANSETRON 4 MG PO TBDP: 4.0000 mg | ORAL_TABLET | Freq: Four times a day (QID) | ORAL | Status: DC | PRN

## 2021-06-11 MED ORDER — LORAZEPAM 1 MG PO TABS
1.0000 mg | ORAL_TABLET | Freq: Four times a day (QID) | ORAL | Status: DC | PRN
  Administered 2021-06-11: 1 mg via ORAL
  Filled 2021-06-11: qty 1

## 2021-06-11 MED ORDER — TRAZODONE HCL 50 MG PO TABS
50.0000 mg | ORAL_TABLET | Freq: Every evening | ORAL | Status: DC | PRN
  Administered 2021-06-11: 50 mg via ORAL
  Filled 2021-06-11: qty 1

## 2021-06-11 MED ORDER — MAGNESIUM HYDROXIDE 400 MG/5ML PO SUSP: 30.0000 mL | Freq: Every day | ORAL | Status: DC | PRN

## 2021-06-11 NOTE — ED Notes (Signed)
Pt refusing nicotine patch due to her wanting to leave

## 2021-06-11 NOTE — ED Provider Notes (Addendum)
Behavioral Health Admission H&P [facility](FBC & OBS)  Date: 06/12/21 Patient Name: Elizabeth CollumOlivia G Thoen MRN: [id number] Chief Complaint:      Diagnoses:  Final diagnoses:  Severe episode of recurrent major depressive disorder, without psychotic features (HCC)  Suicidal ideation    HPI: Elizabeth Daugherty is a 25y/o female. [facility] and accompanied by her mother Rosalita Chessman(Suzanne "Lanney GinsLaura" Clapp) and her sister. Patient was assessed without family present. Patient is alert, oriented x4. Patient is tearful throughout assessment; patient states she is depressed and mood is congruent. She is speaking in a clear tone at moderate rate and volume with good eye contact. She denies all medical complaint including chest pain, SOB, respiratory distress, headache, dizziness, GI/GU complaint. Patient reports daily alcohol use. She denies hx of alcohol withdraw seizure or DTs.   Patient presents with chief complaint of worsening depression, suicidal ideation, and alcohol abuse.  Patient reports that she is experiencing worsening depression. She reports that she is prescribed Fluoxetine 10mg /daily for depression and Xanax 0.25mg  prn for anxiety. She reports that she is complaint with her medications but believes that the fluoxetine 10mg /day is no longer effective for her depression. She is endorsing depressive symptoms of crying spell, hopelessness, worthlessness, isolation, guilty, irritability, poor appetite, poor sleep, poor concentration, and worsening anxiety.   Patient states "I hate waking up; I don't like the way I'm going from day to day." Patient was ask if she is able to contract for safety and she stated "I can't trust myself. I feel like I'm walking a path that's going to get me dead." She endorses suicidal ideation with plans to "take a knife and stab myself in the stomach or drink myself to death." She reports that she lives alone and has access to "knife set" in her home that she can use to harm  herself. She admits to prior history of self harming by cutting her wrist/forearm; she reports she last engaged in cutting about a month ago. She denies prior suicidal attempt. She denies HI/AVH/paranoia and there was no indication of delusion thought content.   Patient reports daily alcohol consumption; she admits to drinking "2 or more bottles of wine nightly." She states "If I'm not drinking, I don't feel anything."She reports that she "battles" with her conscience daily about her "addiction" to alcohol. She also endorses daily marijuana use but states she is not addicted to marijuana. She admits to smoking approximately 3-4 bowls of marijuana daily. She denies all other substance abuse.   Patient gave verbal consent to obtain collateral from her mother Thereasa DistanceSuzanne Laura Brensinger [phone].  Per Jenny Reichmannreylese Stover, [facility]:  *Pt consented for clinician to talk to her mother to obtain additional information. Per mother, the pt's friend called expressing the pt isn't feeling well and she still needs help. [name] reports, the pt is unhappy, has anxiety she tries to cope with alcohol and marijuana; she can do good for a while, she doesn't eat or sleep property. Per mother the started AA but stopped going in two weeks. [name] reports, before the Pandemic the pt hands would curls up couldn't feel her feet, mouth or hands, and heart racing. [name] doesn't feel the pt can contract for safety if discharged.*  PHQ 2-9:   [facility]  C-SSRS RISK CATEGORY High Risk Moderate Risk        Total Time  spent with patient: 45 minutes  Musculoskeletal  Strength & Muscle Tone: within normal limits Gait & Station: normal Patient leans: Right  Psychiatric Specialty Exam  Presentation General Appearance: Disheveled  Eye Contact:Good  Speech:Clear and  Coherent  Speech Volume:Normal  Handedness:Right   Mood and Affect  Mood:Depressed  Affect:Congruent   Thought Process  Thought Processes:Coherent  Descriptions of Associations:Intact  Orientation:Full (Time, Place and Person)  Thought Content:WDL  Diagnosis of Schizophrenia or Schizoaffective disorder in past: No   Hallucinations:Hallucinations: None  Ideas of Reference:None  Suicidal Thoughts:Suicidal Thoughts: Yes, Active SI Active Intent and/or Plan: With Plan; Without Intent  Homicidal Thoughts:Homicidal Thoughts: No   Sensorium  Memory:Immediate Good; Recent Good; Remote Good  Judgment:Fair  Insight:Fair   Executive Functions  Concentration:Good  Attention Span:Good  Recall:Good  Fund of Knowledge:Good  Language:Good   Psychomotor Activity  Psychomotor Activity:Psychomotor Activity: Normal   Assets  Assets:Communication Skills; Desire for Improvement; Housing; Social Support; Physical Health; Vocational/Educational; Transportation   Sleep  Sleep:Sleep: Poor Number of Hours of Sleep: 4   Nutritional Assessment (For OBS and FBC admissions only) Has the patient had a weight loss or gain of 10 pounds or more in the last 3 months?: No Has the patient had a decrease in food intake/or appetite?: Yes Does the patient have dental problems?: No Does the patient have eating habits or behaviors that may be indicators of an eating disorder including binging or inducing vomiting?: Yes Has the patient recently lost weight without trying?: No Has the patient been eating poorly because of a decreased appetite?: No Malnutrition Screening Tool Score: 0   Physical Exam Vitals and nursing note reviewed.  Constitutional:      General: She is not in acute distress.    Appearance: She is well-developed. She is not ill-appearing, toxic-appearing or diaphoretic.  HENT:     Head: Normocephalic and atraumatic.  Eyes:     Conjunctiva/sclera: Conjunctivae  normal.  Cardiovascular:     Rate and Rhythm: Normal rate.  Pulmonary:     Effort: Pulmonary effort is normal. No respiratory distress.     Breath sounds: Normal breath sounds.  Abdominal:     Palpations: Abdomen is soft.     Tenderness: There is no abdominal tenderness.  Musculoskeletal:        General: Normal range of motion.     Cervical back: Normal range of motion.  Skin:    General: Skin is warm and dry.  Neurological:     Mental Status: She is alert and oriented to person, place, and time.  Psychiatric:        Attention and Perception: Attention normal. She does not perceive visual hallucinations.        Mood and Affect: Mood is anxious and depressed. Affect is tearful.        Speech: Speech normal.        Behavior: Behavior normal. Behavior is cooperative.        Thought Content: Thought content normal. Thought content is not paranoid or delusional. Thought content does not include homicidal or suicidal ideation. Thought content does not include homicidal or suicidal plan.        Cognition and Memory: Cognition normal.        Judgment: Judgment normal.   Review of Systems  Constitutional: Negative.   HENT: Negative.    Eyes: Negative.   Respiratory: Negative.    Cardiovascular: Negative.   Gastrointestinal: Negative.   Genitourinary: Negative.   Musculoskeletal: Negative.   Skin:  Negative.   Neurological: Negative.   Endo/Heme/Allergies: Negative.   Psychiatric/Behavioral:  Positive for depression, substance abuse and suicidal ideas. Negative for hallucinations and memory loss. The patient is nervous/anxious and has insomnia.    Blood pressure (!) 135/98, pulse 81, temperature 98.6 F (37 C), temperature source Oral, resp. rate 18, SpO2 96 %. There is no height or weight on file to calculate BMI.  Past Psychiatric History:    Is the patient at risk to self? Yes  Has the patient been a risk to self in the past 6 months? Yes .    Has the patient been a risk to self  within the distant past? No   Is the patient a risk to others? No   Has the patient been a risk to others in the past 6 months? No   Has the patient been a risk to others within the distant past? No   Past Medical History:  Past Medical History:  Diagnosis Date   Fracture of elbow, medial condyle, left, closed 02/18/2015   medial epicondyle fx.    Past Surgical History:  Procedure Laterality Date   ORIF ELBOW FRACTURE Left 03/02/2015   Procedure: OPEN REDUCTION INTERNAL FIXATION (ORIF) ELBOW MEDIAL EPICONDYLE FRACTURE;  Surgeon: Jones Broom, MD;  Location: [facility];  Service: Orthopedics;  Laterality: Left;  Open reduction internal fixation left elbow medial epicondyle fracture   TONSILLECTOMY  10/30/2013    Family History: No family history on file.  Social History:  Social History   Socioeconomic History   Marital status: Single    Spouse name: Not on file   Number of children: Not on file   Years of education: Not on file   Highest education level: Not on file  Occupational History   Not on file  Tobacco Use   Smoking status: Every Day    Packs/day: 0.50    Years: 3.00    Pack years: 1.50    Types: Cigarettes   Smokeless tobacco: Never  Substance and Sexual Activity   Alcohol use: Yes    Comment: occasionally   Drug use: No   Sexual activity: Not Currently    Birth control/protection: Pill  Other Topics Concern   Not on file  Social History Narrative   Not on file   Social Determinants of Health   Financial Resource Strain: Not on file  Food Insecurity: Not on file  Transportation Needs: Not on file  Physical Activity: Not on file  Stress: Not on file  Social Connections: Not on file  Intimate Partner Violence: Not on file    SDOH:  SDOH Screenings   Alcohol Screen: Not on file  Depression (PHQ2-9): Not on file  Financial Resource Strain: Not on file  Food Insecurity: Not on file  Housing: Not on file  Physical Activity: Not on  file  Social Connections: Not on file  Stress: Not on file  Tobacco Use: High Risk   Smoking Tobacco Use: Every Day   Smokeless Tobacco Use: Never  Transportation Needs: Not on file    Last Labs:  Admission on 06/11/2021  Component Date Value Ref Range Status   SARS Coronavirus 2 Ag 06/11/2021 Negative  Negative Preliminary    Allergies: Patient has no known allergies.  PTA Medications: (Not in a hospital admission)   Medical Decision Making  Patient recommended for inpatient psychiatric treatment. Patient will be admitted to [facility] for continuous assessment while awaiting inpatient bed to become available.  -discussed inpatient  psychiatric admission/treatment recommendation with patient and her mother; patient reluctantly agreed to plan for inpatient admission and treatment.  -TTS counselor will seek inpatient psychiatric bed availability at [facility] -obtain labs/review available lab results -CIWA protocol -continue home medication, increase fluoxetine 10mg /day to fluoxetine 20mg /day    Recommendations  Based on my evaluation the patient does not appear to have an emergency medical condition.  , NP 06/12/21  12:21 AM

## 2021-06-11 NOTE — ED Notes (Signed)
Pt sleeping@this  time. Breathing even and unlabored, will continue to monitor for safety

## 2021-06-11 NOTE — BH Assessment (Addendum)
Comprehensive Clinical Assessment (CCA) Note  06/11/2021 Elizabeth Daugherty [id number]  Disposition: Cecilio AsperEne Ajibola, NP recommends inpatient treatment. Per Hassie BruceKim, AC, RN pt has been accepted to [facility] and assigned to room/bed: 300-2. Attending physician: Dr. Jola Babinskilary.   *Pt left [facility] Against Medical Advice. Clinician initiated IVC paperwork.*   Flowsheet Row [facility]  C-SSRS RISK CATEGORY High Risk Moderate Risk      The patient demonstrates the following risk factors for suicide: Chronic risk factors for suicide include: psychiatric disorder of Major Depressive Disorder, substance use disorder, and previous self-harm Pt reports, cutting a month ago . Acute risk factors for suicide include:  depression, suicidal thoughts . Protective factors for this patient include: positive social support. Considering these factors, the overall suicide risk at this point appears to be high. Patient is not appropriate for outpatient follow up.  Elizabeth Daugherty is 25 year old female who [facility] her mother [name](Laura "Quintin AltoSuzanne" Mccrone) and sister. Pt's assessment was completed alone. Clinician asked the pt, "what brought you to the hospital?" Per, pt, "I don't like the way I'm going from day to day." Pt reports, it's hard for her to go to sleep, to stay asleep and wake up. Per pt, she hates waking up but wants to feel like a better person. Pt reports, she's suicidal; wanting to drink herself to death or take a knife and end it all by stabbing herself in the stomach. Pt reports, she feels like she wants to do something extreme but she doesn't want to. Pt reports, she battles with her conscience if she should drink in the middle of the day. Pt reports, having access to a "knife set." Pt reports, she cut herself a month ago. Pt denies, previous suicide attempts, HI, AVH.    Pt reports, drinking two or more bottles of wine daily. Pt reports, 3-4 bowls of marijuana daily. Per pt, her PCP (Dr. Valentina LucksGriffin) prescribes her Prozac 10mg  and Xanax .25mg . Pt denies, being linked to OPT resources (medication management and/or counseling.) Pt denies, previous inpatient admissions.   Pt presents disheveled, tearful, frustrated (at times) with normal speech. Pt's mood, affect was anxious, depressed. Pt's thought content was appropriate to mood and circumstances. Clinician asked the pt if she can contract for safety. Pt reports, she feels fine at the moment but "I don't trust myself." During the assessment the pt would become upset (crying) would say she wants to leave then say if she can vape she will be willing to stay, staff discussed the no-smoking policy and she will be unable to vape. [facility] then wanted to leave. And left Against Medical Advice [facility]   Diagnosis: Major Depressive Disorder, recurrent, severe without psychotic features.                     Alcohol use Disorder, severe.                    Cannabis use Disorder, severe.   *Pt consented for clinician to talk to her mother to obtain additional information. Per mother, the pt's friend called expressing the pt isn't feeling well and she still needs help. [name] reports, the pt is unhappy, has anxiety. Per mother, the pt tries to cope with alcohol and marijuana; she can do good for a while. [name], reports, the  pt doesn't eat or sleep property. Per mother, the pt started AA but stopped going in two weeks ago. [name] reports, before the Pandemic the pt hands curl up, she would loose feeling her feet, and her heart would racing. [name] doesn't feel the pt can contract for safety if discharged.*   *[name] and sister went to the Magistrate's Office to complete IVC paperwork (pt is a danger to herself) the pt was attempting to leave. Per family, the [facility] told them since the pt was at the [facility] they could not initiate IVC paperwork.*   Chief Complaint:  Chief Complaint  Patient presents with   Urgent Emergent Eval   Visit Diagnosis:     CCA Screening, Triage and Referral (STR)  Patient Reported Information How did you hear about [place]? Family/Friend  What Is the Reason for Your Visit/Call Today? Suicidal thoughts, depression, anxiety.  How Long Has This Been Causing You Problems? > than 6 months  What Do You Feel Would Help You the Most Today? Treatment for Depression or other mood problem; Alcohol or Drug Use Treatment   Have You Recently Had Any Thoughts About Hurting Yourself? Yes  Are You Planning to Commit Suicide/Harm Yourself At This time? Yes   Have you Recently Had Thoughts About Hurting Someone Elizabeth Daugherty? No  Are You Planning to Harm Someone at This Time? No  Explanation: No data recorded  Have You Used Any Alcohol or Drugs in the Past 24 Hours? Yes  How Long Ago Did You Use Drugs or Alcohol? No data recorded What Did You Use and How Much? Alochol and marijuana.   Do You Currently Have a Therapist/Psychiatrist? No (Pt's PCP prescribes  her medications.)  Name of Therapist/Psychiatrist: No data recorded  Have You Been Recently Discharged From Any Office Practice or Programs? No  Explanation of Discharge From Practice/Program: No data recorded    CCA Screening Triage Referral Assessment Type of Contact: Face-to-Face  Telemedicine Service Delivery:   Is this Initial or Reassessment? No data recorded Date Telepsych consult ordered in CHL:  No data recorded Time Telepsych consult ordered in CHL:  No data recorded Location of Assessment: [facility] Assessment Services  Provider Location: [facility] Assessment Services   Collateral Involvement: Chrysten Woulfe, mother, [phone].   Does Patient Have a Automotive engineer Guardian? No data recorded Name and Contact of Legal Guardian: No data  recorded If Minor and Not Living with Parent(s), Who has Custody? No data recorded Is CPS involved or ever been involved? Never  Is APS involved or ever been involved? Never   Patient Determined To Be At Risk for Harm To Self or Others Based on Review of Patient Reported Information or Presenting Complaint? Yes, for Self-Harm  Method: No data recorded Availability of Means: No data recorded Intent: No data recorded Notification Required: No data recorded Additional Information for Danger to Others Potential: No data recorded Additional Comments for Danger to Others Potential: No data recorded Are There Guns or Other Weapons in Your Home? No data recorded Types of Guns/Weapons: No data recorded Are These Weapons Safely Secured?                            No data recorded Who Could Verify You Are Able To Have These Secured: No data recorded Do You Have any Outstanding Charges, Pending Court Dates, Parole/Probation? No data recorded Contacted To Inform of Risk of  Harm To Self or Others: Family/Significant Other:    Does Patient Present under Involuntary Commitment? No  IVC Papers Initial File Date: No data recorded  [place] of Residence: [place]   Patient Currently Receiving the Following Services: Not Receiving Services   Determination of Need: Emergent (2 hours)   Options For Referral: Inpatient Hospitalization     CCA Biopsychosocial Patient Reported Schizophrenia/Schizoaffective Diagnosis in Past: No   Strengths: Family supports, communication.   Mental Health Symptoms Depression:   Increase/decrease in appetite; Sleep (too much or little); Worthlessness; Hopelessness; Fatigue; Difficulty Concentrating; Weight gain/loss; Tearfulness (Isolation, guilt/blame.)   Duration of Depressive symptoms:  Duration of Depressive Symptoms: Greater than two weeks   Mania:   None   Anxiety:    Tension; Worrying; Sleep; Irritability; Difficulty concentrating; Fatigue;  Restlessness (Panic attacks two days ago.)   Psychosis:   None   Duration of Psychotic symptoms:    Trauma:   None   Obsessions:   None   Compulsions:   Repeated behaviors/mental acts   Inattention:   N/A   Hyperactivity/Impulsivity:   Feeling of restlessness; Fidgets with hands/feet   Oppositional/Defiant Behaviors:   Aggression towards people/animals; Resentful; Temper   Emotional Irregularity:   Recurrent suicidal behaviors/gestures/threats   Other Mood/Personality Symptoms:   NA    Mental Status Exam Appearance and self-care  Stature:   Tall   Weight:   Average weight   Clothing:   Disheveled   Grooming:   Well-groomed   Cosmetic use:   None   Posture/gait:   Normal   Motor activity:   Not Remarkable   Sensorium  Attention:   Normal   Concentration:   Normal   Orientation:   X5   Recall/memory:   Normal   Affect and Mood  Affect:   Anxious; Depressed   Mood:   Anxious; Depressed   Relating  Eye contact:   Normal   Facial expression:   Anxious; Depressed   Attitude toward examiner:   Cooperative   Thought and Language  Speech flow:  Normal   Thought content:   Appropriate to Mood and Circumstances   Preoccupation:   None   Hallucinations:   None   Organization:  No data recorded  [employer] of Knowledge:   Average   Intelligence:   Average   Abstraction:   Normal   Judgement:   Poor   Reality Testing:   Adequate   Insight:   Fair   Decision Making:   Impulsive   Social Functioning  Social Maturity:   Impulsive   Social Judgement:   Normal   Stress  Stressors:   Family conflict; Work (Finding out her father cheated on her mother and having to tell her.)   Coping Ability:   Human resources officer Deficits:   None; Decision making   Supports:   Family; Friends/Service system     Religion: Religion/Spirituality Are You A Religious Person?: No How Might This  Affect Treatment?: NA  Leisure/Recreation:    Exercise/Diet: Exercise/Diet Do You Exercise?: Yes (Pt reports, she used to.) Have You Gained or Lost A Significant Amount of Weight in the Past Six Months?: No Do You Follow a Special Diet?: Yes Type of Diet: Pt tries to stick to a Vegan diet but lately has ate cheese, etc. Do You Have Any Trouble Sleeping?: Yes Explanation of Sleeping Difficulties: Pt reports, trouble sleeping, staying asleep, waking up.   CCA Employment/Education Employment/Work Situation: Employment / Work Psychologist, occupational  Employment Situation: Employed Has Patient ever Been in Equities trader?: No  Education: Education Is Patient Currently Attending School?: No Last Grade Completed: 12 Did You Product manager?: No   CCA Family/Childhood History Family and Relationship History: Family history Marital status: Single Does patient have children?: No  Childhood History:  Childhood History By whom was/is the patient raised?: Both parents Did patient suffer any verbal/emotional/physical/sexual abuse as a child?: No Did patient suffer from severe childhood neglect?: No Has patient ever been sexually abused/assaulted/raped as an adolescent or adult?: No Was the patient ever a victim of a crime or a disaster?: No Witnessed domestic violence?: No Has patient been affected by domestic violence as an adult?:  (NA)  Child/Adolescent Assessment:     CCA Substance Use Alcohol/Drug Use: Alcohol / Drug Use Pain Medications: Denies abuse Prescriptions: Denies abuse Over the Counter: Denies abuse History of alcohol / drug use?: Yes Negative Consequences of Use:  (Pt denies) Withdrawal Symptoms:  (Pt denies) Substance #1 Name of Substance 1: Alcohol. 1 - Age of First Use: For 10-12 years. 1 - Amount (size/oz): Pt reports, drinking two or more bottles of wine daily. 1 - Frequency: Daily. 1 - Duration: Ongoing. 1 - Last Use / Amount: Per pt, this morning. 1 - Method of  Aquiring: Purchase. 1- Route of Use: Oral. Substance #2 Name of Substance 2: Marijuana. 2 - Age of First Use: UTA 2 - Amount (size/oz): Pt reports, 3-4 bowls of marijuana daily. 2 - Frequency: Daily. 2 - Duration: Ongoing. 2 - Last Use / Amount: Per pt, this afternoon. 2 - Method of Aquiring: UTA 2 - Route of Substance Use: Smoke.    ASAM's:  Six Dimensions of Multidimensional Assessment  Dimension 1:  Acute Intoxication and/or Withdrawal Potential:   Dimension 1:  Description of individual's past and current experiences of substance use and withdrawal: Pt reports daily alcohol and marijuana use. Denies current withdrawal symptoms.  Dimension 2:  Biomedical Conditions and Complications:   Dimension 2:  Description of patient's biomedical conditions and  complications: None  Dimension 3:  Emotional, Behavioral, or Cognitive Conditions and Complications:  Dimension 3:  Description of emotional, behavioral, or cognitive conditions and complications: Pt has history of depression and anxiety.  Dimension 4:  Readiness to Change:  Dimension 4:  Description of Readiness to Change criteria: Initally, pt expressed wanting help however currently pt is going back and forth of wanting to stay and wanting to go home.  Dimension 5:  Relapse, Continued use, or Continued Problem Potential:  Dimension 5:  Relapse, continued use, or continued problem potential critiera description: Pt reports, drinking alochol and smoking marijuna daily.  Dimension 6:  Recovery/Living Environment:  Dimension 6:  Recovery/Iiving environment criteria description: Pt lives alone  ASAM Severity Score: ASAM's Severity Rating Score: 9  ASAM Recommended Level of Treatment: ASAM Recommended Level of Treatment: Level II Intensive Outpatient Treatment   Substance use Disorder (SUD) Substance Use Disorder (SUD)  Checklist Symptoms of Substance Use: Continued use despite having a persistent/recurrent physical/psychological problem  caused/exacerbated by use  Recommendations for Services/Supports/Treatments: Recommendations for Services/Supports/Treatments Recommendations For Services/Supports/Treatments: Inpatient Hospitalization  Discharge Disposition:    DSM5 Diagnoses: There are no problems to display for this patient.    Referrals to Alternative Service(s): Referred to Alternative Service(s):   Place:   Date:   Time:    Referred to Alternative Service(s):   Place:   Date:   Time:    Referred to Alternative Service(s):   Place:  Date:   Time:    Referred to Alternative Service(s):   Place:   Date:   Time:     Redmond Pulling, [facility] Comprehensive Clinical Assessment (CCA) Screening, Triage and Referral Note  06/11/2021 MILEA KLINK [id number]  Chief Complaint:  Chief Complaint  Patient presents with   Urgent Emergent Eval   Visit Diagnosis:   Patient Reported Information How did you hear about [place]? Family/Friend  What Is the Reason for Your Visit/Call Today? Suicidal thoughts, depression, anxiety.  How Long Has This Been Causing You Problems? > than 6 months  What Do You Feel Would Help You the Most Today? Treatment for Depression or other mood problem; Alcohol or Drug Use Treatment   Have You Recently Had Any Thoughts About Hurting Yourself? Yes  Are You Planning to Commit Suicide/Harm Yourself At This time? Yes   Have you Recently Had Thoughts About Hurting Someone Elizabeth Daugherty? No  Are You Planning to Harm Someone at This Time? No  Explanation: No data recorded  Have You Used Any Alcohol or Drugs in the Past 24 Hours? Yes  How Long Ago Did You Use Drugs or Alcohol? No data recorded What Did You Use and How Much? Alochol and marijuana.   Do You Currently Have a Therapist/Psychiatrist? No (Pt's PCP prescribes  her medications.)  Name of Therapist/Psychiatrist: No data recorded  Have You Been Recently Discharged From Any Office Practice or Programs? No  Explanation of Discharge From  Practice/Program: No data recorded   CCA Screening Triage Referral Assessment Type of Contact: Face-to-Face  Telemedicine Service Delivery:   Is this Initial or Reassessment? No data recorded Date Telepsych consult ordered in CHL:  No data recorded Time Telepsych consult ordered in CHL:  No data recorded Location of Assessment: [facility] Assessment Services  Provider Location: [facility] Assessment Services   Collateral Involvement: Elizabeth Daugherty, mother, [phone].   Does Patient Have a Automotive engineer Guardian? No data recorded Name and Contact of Legal Guardian: No data recorded If Minor and Not Living with Parent(s), Who has Custody? No data recorded Is CPS involved or ever been involved? Never  Is APS involved or ever been involved? Never   Patient Determined To Be At Risk for Harm To Self or Others Based on Review of Patient Reported Information or Presenting Complaint? Yes, for Self-Harm  Method: No data recorded Availability of Means: No data recorded Intent: No data recorded Notification Required: No data recorded Additional Information for Danger to Others Potential: No data recorded Additional Comments for Danger to Others Potential: No data recorded Are There Guns or Other Weapons in Your Home? No data recorded Types of Guns/Weapons: No data recorded Are These Weapons Safely Secured?                            No data recorded Who Could Verify You Are Able To Have These Secured: No data recorded Do You Have any Outstanding Charges, Pending Court Dates, Parole/Probation? No data recorded Contacted To Inform of Risk of Harm To Self or Others: Family/Significant Other:   Does Patient Present under Involuntary Commitment? No  IVC Papers Initial File Date: No data recorded  [place] of Residence: [place]   Patient Currently Receiving the Following Services: Not Receiving Services   Determination of Need: Emergent (2 hours)   Options For Referral:  Inpatient Hospitalization   Discharge Disposition:     Redmond Pulling, [facility]  Redmond Pulling, MS, [facility], [facility] Triage Specialist [phone]

## 2021-06-11 NOTE — BH Assessment (Signed)
Elizabeth Daugherty, urgent MR # [id number]; 25 years old presents this date with her mother, Ciearra Rufo, [phone].  Pt reports SI and AVH.  Pt denies HI. Pt reports cutting left arm three months ago.  Pt reports the voices are telling her to hurt herself. Pt admits to MH diagnosis; also, currently taking prescribed medication for symptom management.  MSE signed by patient.

## 2021-06-12 ENCOUNTER — Encounter (HOSPITAL_COMMUNITY): Payer: Self-pay | Admitting: Emergency Medicine

## 2021-06-12 ENCOUNTER — Inpatient Hospital Stay (HOSPITAL_COMMUNITY)
Admission: AD | Admit: 2021-06-12 | Discharge: 2021-06-13 | DRG: 885 | Disposition: A | Discharge status: Home / Self Care | Payer: Commercial Managed Care - PPO | Source: Ambulatory Visit | Attending: Psychiatry | Admitting: Psychiatry

## 2021-06-12 ENCOUNTER — Emergency Department (HOSPITAL_COMMUNITY)
Admission: EM | Admit: 2021-06-12 | Discharge: 2021-06-12 | Disposition: A | Discharge status: Other Acute Inpatient Hospital | Payer: Commercial Managed Care - PPO | Source: Home / Self Care | Attending: Emergency Medicine | Admitting: Emergency Medicine

## 2021-06-12 ENCOUNTER — Other Ambulatory Visit: Payer: Self-pay

## 2021-06-12 ENCOUNTER — Encounter (HOSPITAL_COMMUNITY): Payer: Self-pay | Admitting: Psychiatry

## 2021-06-12 DIAGNOSIS — Z793 Long term (current) use of hormonal contraceptives: Secondary | ICD-10-CM

## 2021-06-12 DIAGNOSIS — F10920 Alcohol use, unspecified with intoxication, uncomplicated: Secondary | ICD-10-CM

## 2021-06-12 DIAGNOSIS — Z79890 Hormone replacement therapy (postmenopausal): Secondary | ICD-10-CM

## 2021-06-12 DIAGNOSIS — F17210 Nicotine dependence, cigarettes, uncomplicated: Secondary | ICD-10-CM | POA: Diagnosis present

## 2021-06-12 DIAGNOSIS — F10120 Alcohol abuse with intoxication, uncomplicated: Secondary | ICD-10-CM | POA: Insufficient documentation

## 2021-06-12 DIAGNOSIS — F332 Major depressive disorder, recurrent severe without psychotic features: Principal | ICD-10-CM

## 2021-06-12 DIAGNOSIS — F1020 Alcohol dependence, uncomplicated: Secondary | ICD-10-CM | POA: Diagnosis not present

## 2021-06-12 DIAGNOSIS — Z046 Encounter for general psychiatric examination, requested by authority: Secondary | ICD-10-CM

## 2021-06-12 DIAGNOSIS — Z20822 Contact with and (suspected) exposure to covid-19: Secondary | ICD-10-CM | POA: Diagnosis present

## 2021-06-12 DIAGNOSIS — R4589 Other symptoms and signs involving emotional state: Secondary | ICD-10-CM | POA: Diagnosis present

## 2021-06-12 DIAGNOSIS — F1024 Alcohol dependence with alcohol-induced mood disorder: Secondary | ICD-10-CM

## 2021-06-12 DIAGNOSIS — G4700 Insomnia, unspecified: Secondary | ICD-10-CM | POA: Diagnosis present

## 2021-06-12 DIAGNOSIS — F10229 Alcohol dependence with intoxication, unspecified: Secondary | ICD-10-CM | POA: Diagnosis present

## 2021-06-12 DIAGNOSIS — Z9152 Personal history of nonsuicidal self-harm: Secondary | ICD-10-CM

## 2021-06-12 DIAGNOSIS — F419 Anxiety disorder, unspecified: Secondary | ICD-10-CM | POA: Diagnosis present

## 2021-06-12 DIAGNOSIS — R4781 Slurred speech: Secondary | ICD-10-CM | POA: Insufficient documentation

## 2021-06-12 DIAGNOSIS — Z79899 Other long term (current) drug therapy: Secondary | ICD-10-CM

## 2021-06-12 DIAGNOSIS — F10230 Alcohol dependence with withdrawal, uncomplicated: Secondary | ICD-10-CM

## 2021-06-12 DIAGNOSIS — F1994 Other psychoactive substance use, unspecified with psychoactive substance-induced mood disorder: Secondary | ICD-10-CM

## 2021-06-12 DIAGNOSIS — R45851 Suicidal ideations: Secondary | ICD-10-CM | POA: Diagnosis present

## 2021-06-12 DIAGNOSIS — Y908 Blood alcohol level of 240 mg/100 ml or more: Secondary | ICD-10-CM | POA: Diagnosis present

## 2021-06-12 DIAGNOSIS — R7309 Other abnormal glucose: Secondary | ICD-10-CM | POA: Insufficient documentation

## 2021-06-12 LAB — RAPID URINE DRUG SCREEN, HOSP PERFORMED
Amphetamines: NOT DETECTED
Barbiturates: NOT DETECTED
Benzodiazepines: NOT DETECTED
Cocaine: NOT DETECTED
Opiates: NOT DETECTED
Tetrahydrocannabinol: POSITIVE — AB

## 2021-06-12 LAB — CBC WITH DIFFERENTIAL/PLATELET
Abs Immature Granulocytes: 0 10*3/uL (ref 0.00–0.07)
Basophils Absolute: 0.1 10*3/uL (ref 0.0–0.1)
Basophils Relative: 1 %
Eosinophils Absolute: 0 10*3/uL (ref 0.0–0.5)
Eosinophils Relative: 1 %
HCT: 43.7 % (ref 36.0–46.0)
Hemoglobin: 14.9 g/dL (ref 12.0–15.0)
Immature Granulocytes: 0 %
Lymphocytes Relative: 50 %
Lymphs Abs: 2.6 10*3/uL (ref 0.7–4.0)
MCH: 33.3 pg (ref 26.0–34.0)
MCHC: 34.1 g/dL (ref 30.0–36.0)
MCV: 97.5 fL (ref 80.0–100.0)
Monocytes Absolute: 0.3 10*3/uL (ref 0.1–1.0)
Monocytes Relative: 6 %
Neutro Abs: 2.2 10*3/uL (ref 1.7–7.7)
Neutrophils Relative %: 42 %
Platelets: 239 10*3/uL (ref 150–400)
RBC: 4.48 MIL/uL (ref 3.87–5.11)
RDW: 12.6 % (ref 11.5–15.5)
WBC: 5.2 10*3/uL (ref 4.0–10.5)
nRBC: 0 % (ref 0.0–0.2)

## 2021-06-12 LAB — I-STAT BETA HCG BLOOD, ED (MC, WL, AP ONLY): I-stat hCG, quantitative: <5 m[IU]/mL (ref <5)

## 2021-06-12 LAB — ETHANOL: Alcohol, Ethyl (B): 317 mg/dL (ref <10)

## 2021-06-12 LAB — COMPREHENSIVE METABOLIC PANEL
ALT: 23 U/L (ref 0–44)
AST: 41 U/L (ref 15–41)
Albumin: 4.9 g/dL (ref 3.5–5.0)
Alkaline Phosphatase: 73 U/L (ref 38–126)
Anion gap: 16 — ABNORMAL HIGH (ref 5–15)
BUN: 5 mg/dL — ABNORMAL LOW (ref 6–20)
CO2: 19 mmol/L — ABNORMAL LOW (ref 22–32)
Calcium: 9.1 mg/dL (ref 8.9–10.3)
Chloride: 107 mmol/L (ref 98–111)
Creatinine, Ser: 0.67 mg/dL (ref 0.44–1.00)
GFR, Estimated: >60 mL/min (ref >60)
Glucose, Bld: 59 mg/dL — ABNORMAL LOW (ref 70–99)
Potassium: 4 mmol/L (ref 3.5–5.1)
Sodium: 142 mmol/L (ref 135–145)
Total Bilirubin: 0.7 mg/dL (ref 0.3–1.2)
Total Protein: 8.4 g/dL — ABNORMAL HIGH (ref 6.5–8.1)

## 2021-06-12 LAB — RESP PANEL BY RT-PCR (FLU A&B, COVID) ARPGX2
Influenza A by PCR: NEGATIVE
Influenza A by PCR: NEGATIVE
Influenza B by PCR: NEGATIVE
Influenza B by PCR: NEGATIVE
SARS Coronavirus 2 by RT PCR: NEGATIVE
SARS Coronavirus 2 by RT PCR: NEGATIVE

## 2021-06-12 LAB — TSH: TSH: 1.492 u[IU]/mL (ref 0.350–4.500)

## 2021-06-12 LAB — CBG MONITORING, ED: Glucose-Capillary: 53 mg/dL — ABNORMAL LOW (ref 70–99)

## 2021-06-12 LAB — GLUCOSE, CAPILLARY: Glucose-Capillary: 73 mg/dL (ref 70–99)

## 2021-06-12 MED ORDER — THIAMINE HCL 100 MG/ML IJ SOLN: 100.0000 mg | Freq: Once | INTRAMUSCULAR | Status: DC

## 2021-06-12 MED ORDER — ALUM & MAG HYDROXIDE-SIMETH 200-200-20 MG/5ML PO SUSP: 30.0000 mL | ORAL | Status: DC | PRN

## 2021-06-12 MED ORDER — TRAZODONE HCL 50 MG PO TABS: 50.0000 mg | ORAL_TABLET | Freq: Every evening | ORAL | Status: DC | PRN

## 2021-06-12 MED ORDER — MAGNESIUM HYDROXIDE 400 MG/5ML PO SUSP: 30.0000 mL | Freq: Every day | ORAL | Status: DC | PRN

## 2021-06-12 MED ORDER — THIAMINE HCL 100 MG PO TABS
100.0000 mg | ORAL_TABLET | Freq: Every day | ORAL | Status: DC
  Administered 2021-06-12 – 2021-06-13 (×2): 100 mg via ORAL
  Filled 2021-06-12 (×5): qty 1

## 2021-06-12 MED ORDER — LORAZEPAM 1 MG PO TABS: 1.0000 mg | ORAL_TABLET | Freq: Four times a day (QID) | ORAL | Status: DC | PRN

## 2021-06-12 MED ORDER — ACETAMINOPHEN 325 MG PO TABS: 650.0000 mg | ORAL_TABLET | Freq: Four times a day (QID) | ORAL | Status: DC | PRN

## 2021-06-12 MED ORDER — FLUOXETINE HCL 20 MG PO CAPS
20.0000 mg | ORAL_CAPSULE | Freq: Every day | ORAL | Status: DC
  Administered 2021-06-12 – 2021-06-13 (×2): 20 mg via ORAL
  Filled 2021-06-12 (×4): qty 1

## 2021-06-12 MED ORDER — FOLIC ACID 1 MG PO TABS
1.0000 mg | ORAL_TABLET | Freq: Every day | ORAL | Status: DC
  Administered 2021-06-12 – 2021-06-13 (×2): 1 mg via ORAL
  Filled 2021-06-12 (×5): qty 1

## 2021-06-12 MED ORDER — DOXEPIN HCL 25 MG PO CAPS
25.0000 mg | ORAL_CAPSULE | Freq: Every evening | ORAL | Status: DC | PRN
  Administered 2021-06-12: 25 mg via ORAL
  Filled 2021-06-12: qty 1

## 2021-06-12 MED ORDER — HYDROXYZINE HCL 25 MG PO TABS
25.0000 mg | ORAL_TABLET | Freq: Three times a day (TID) | ORAL | Status: DC | PRN
  Administered 2021-06-12: 25 mg via ORAL
  Filled 2021-06-12: qty 1

## 2021-06-12 NOTE — ED Provider Notes (Addendum)
Patient had expressed earlier that she wanted to leave but decided to stay at [facility] and agreed to inpatient psychiatric admission and treatment after speaking with her sister and her mother. Patient's family said they don't believe patient can maintain safety if discharge. Patient's family expressed that they would like to involuntary commit (IVC) patient and went to the magistrate's office to petition for IVC against patient .    Patient's mother called to updated nursing staff. This Clinical research associate called patient's mother back and she stated that per Magistrate, family members are unable to IVC patient if patient is at [facility] voluntarily.  2345- Patient continue to express that she's leaving against medical advise. This Clinical research associate, TTS counselors, and nursing staff attempted to redirect patient and discuss the benefits of inpatient psychiatric treatment. Patient continues to express desire to leave AMA. Patient is at [facility] voluntary  00:05 TTS counselor Caprice Renshaw, [facility] went to the magistrate's office and petitioned patient for IVC due to safety concerns; patient left the facility AMA prior to being served IVC paper by law enforcement. Patient left AMA with her mother and sister.

## 2021-06-12 NOTE — [facility] Group Notes (Signed)
Pt did not attend goals group. 

## 2021-06-12 NOTE — BH Assessment (Signed)
Elizabeth Daugherty, [facility], say Pt is accepted to the service of Dr Jola Babinski, bed 300-2, and can be transported after 0500. Number for RN report is [phone]. Notified Elpidio Anis, PA-C and Julieanne Manson, RN of acceptance.   Pamalee Leyden, [facility], [facility] Triage Specialist [phone]

## 2021-06-12 NOTE — ED Notes (Signed)
Report called to receiving RN, Genevieve Norlander, pt to be transported after 0500.

## 2021-06-12 NOTE — ED Notes (Signed)
Pt belongings placed in 1-8 nursing station cabinet. Pt has 1 belonging bag.

## 2021-06-12 NOTE — ED Triage Notes (Addendum)
Patient here via [employer] reporting IVC for suicidal thoughts today by therapist and [facility].
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
## 2021-06-12 NOTE — Progress Notes (Signed)
   06/12/21 2236  Psych Admission Type (Psych Patients Only)  Admission Status Voluntary  Psychosocial Assessment  Patient Complaints Anxiety  Eye Contact Fair  Facial Expression Anxious  Affect Appropriate to circumstance  Speech Logical/coherent  Interaction Assertive  Motor Activity Fidgety  Appearance/Hygiene Unremarkable  Behavior Characteristics Appropriate to situation  Mood Anxious  Thought Process  Coherency WDL  Delusions None reported or observed  Perception WDL  Hallucination None reported or observed  Judgment Poor  Confusion None  Danger to Self  Current suicidal ideation? Denies  Self-Injurious Behavior No self-injurious ideation or behavior indicators observed or expressed   Agreement Not to Harm Self Yes  Description of Agreement verbal  Danger to Others  Danger to Others None reported or observed

## 2021-06-12 NOTE — Progress Notes (Signed)
Patient rated her day as a 6 out of 10 since she had a bad start to her day. Her day improved once she came into the dayroom with her peers. Her goal for tomorrow is to go home.

## 2021-06-12 NOTE — ED Notes (Addendum)
Provider and mother notified of patient demanding to leave. Provider, TTS and I explained the IVC procedure to the mom and patient.  Per mom, the magistrate's office would not IVC the patient since she was already admitted here. Elizabeth Daugherty on the way to magistrate's office to get the paperwork done. In the meantime we had to let her go AMA.  AMA paperwork signed by patient and she was escorted to the lobby to be with her mom.

## 2021-06-12 NOTE — ED Notes (Signed)
Patient with mom on the way to go home

## 2021-06-12 NOTE — ED Notes (Signed)
PD stated they are waiting for their change of shift, ETA approximately 30 minutes, before transport

## 2021-06-12 NOTE — Progress Notes (Signed)
   06/12/21 1300  Psych Admission Type (Psych Patients Only)  Admission Status Voluntary  Psychosocial Assessment  Patient Complaints Anxiety  Eye Contact Fair  Facial Expression Anxious  Affect Sad  Speech Logical/coherent  Interaction Assertive  Motor Activity Fidgety  Appearance/Hygiene Unremarkable  Behavior Characteristics Cooperative;Anxious  Mood Anxious  Thought Process  Coherency WDL  Content WDL  Delusions None reported or observed  Perception WDL  Hallucination None reported or observed  Judgment Poor  Confusion None  Danger to Self  Current suicidal ideation? Passive  Self-Injurious Behavior Some self-injurious ideation observed or expressed.  No lethal plan expressed   Agreement Not to Harm Self No  Danger to Others  Danger to Others None reported or observed

## 2021-06-12 NOTE — ED Provider Notes (Signed)
[facility]-EMERGENCY DEPT Provider Note   CSN: [id number] Arrival date & time: 06/12/21  0206     History Chief Complaint  Patient presents with   Suicidal   IVC    Elizabeth Daugherty is a 25 y.o. female.  Patient to ED under IVC taken out at [facility] for SI. The patient reports she has an alcohol problem and confided in her mother who wanted her to get treatment. She states she went voluntarily to [facility] but was uncomfortable sleeping there and when she said she wanted to leave and IVC was placed. She denies SI, HI.   The history is provided by the patient and the police. No language interpreter was used.      Past Medical History:  Diagnosis Date   Fracture of elbow, medial condyle, left, closed 02/18/2015   medial epicondyle fx.    There are no problems to display for this patient.   Past Surgical History:  Procedure Laterality Date   ORIF ELBOW FRACTURE Left 03/02/2015   Procedure: OPEN REDUCTION INTERNAL FIXATION (ORIF) ELBOW MEDIAL EPICONDYLE FRACTURE;  Surgeon: Jones Broom, MD;  Location: [facility];  Service: Orthopedics;  Laterality: Left;  Open reduction internal fixation left elbow medial epicondyle fracture   TONSILLECTOMY  10/30/2013     OB History   No obstetric history on file.     No family history on file.  Social History   Tobacco Use   Smoking status: Every Day    Packs/day: 0.50    Years: 3.00    Pack years: 1.50    Types: Cigarettes   Smokeless tobacco: Never  Substance Use Topics   Alcohol use: Yes    Comment: occasionally   Drug use: No    Home Medications Prior to Admission medications   Medication Sig Start Date End Date Taking? Authorizing Provider  docusate sodium (COLACE) 100 MG capsule Take 1 capsule (100 mg total) by mouth 3 (three) times daily as needed. 03/02/15   Jiles Harold, PA-C  FLUoxetine (PROZAC) 10 MG capsule Take 10 mg by mouth daily. 05/19/21   [provider]   levothyroxine (SYNTHROID) 50 MCG tablet Take 50 mcg by mouth every morning. 02/04/21   [provider]  Norethindrone Acet-Ethinyl Est (JUNEL 1.5/30 PO) Take 1 tablet by mouth daily.    [provider]  oxyCODONE-acetaminophen (ROXICET) 5-325 MG per tablet Take 1-2 tablets by mouth every 4 (four) hours as needed for severe pain. 03/02/15   Jiles Harold, PA-C  traMADol (ULTRAM) 50 MG tablet Take 50 mg by mouth every 6 (six) hours as needed.    [provider]    Allergies    Patient has no known allergies.  Review of Systems   Review of Systems  Constitutional:  Negative for chills and fever.  HENT: Negative.    Respiratory: Negative.    Cardiovascular: Negative.   Gastrointestinal: Negative.   Musculoskeletal: Negative.   Skin: Negative.   Neurological: Negative.    Physical Exam Updated Vital Signs BP 134/87   Pulse 97   Temp 98.7 F (37.1 C) (Oral)   Resp 18   SpO2 97%   Physical Exam Vitals and nursing note reviewed.  Constitutional:      Appearance: She is well-developed.     Comments: Acutely intoxicated but coherent, oriented  HENT:     Head: Normocephalic.  Cardiovascular:     Rate and Rhythm: Normal rate and regular rhythm.     Heart sounds:  No murmur heard. Pulmonary:     Effort: Pulmonary effort is normal.     Breath sounds: Normal breath sounds. No wheezing, rhonchi or rales.  Abdominal:     General: Bowel sounds are normal.     Palpations: Abdomen is soft.     Tenderness: There is no abdominal tenderness. There is no guarding or rebound.  Musculoskeletal:        General: Normal range of motion.     Cervical back: Normal range of motion and neck supple.  Skin:    General: Skin is warm and dry.  Neurological:     General: No focal deficit present.     Mental Status: She is alert and oriented to person, place, and time.  Psychiatric:        Attention and Perception: Perception normal.        Mood and Affect: Mood  normal.        Speech: Speech is slurred.        Behavior: Behavior is cooperative.        Thought Content: Thought content does not include suicidal ideation.    ED Results / Procedures / Treatments   Labs (all labs ordered are listed, but only abnormal results are displayed) Labs Reviewed - No data to display  EKG None  Radiology No results found.  Procedures Procedures   Medications Ordered in ED Medications - No data to display  ED Course  I have reviewed the triage vital signs and the nursing notes.  Pertinent labs & imaging results that were available during my care of the patient were reviewed by me and considered in my medical decision making (see chart for details).    MDM Rules/Calculators/A&P                           Patient to ED under IVC for reported SI. She admits to alcohol dependency but denies SI/HI.   TTS consult requested to determine appropriate disposition.   Patient found to meet inpatient criteria and is accepted at [facility].   Final Clinical Impression(s) / ED Diagnoses Final diagnoses:  None   Alcohol intoxication IVC  Rx / DC Orders ED Discharge Orders     None        Elpidio Anis, PA-C 06/12/21 0741    Dione Booze, MD 06/12/21 [phone]

## 2021-06-12 NOTE — BHH Group Notes (Signed)
LCSW Group Therapy Note  06/12/2021   10:00-11:00am   Type of Therapy and Topic:  Group Therapy: Anger Cues and Responses  Participation Level:  Did Not Attend   Description of Group:   In this group, patients learned how to recognize the physical, cognitive, emotional, and behavioral responses they have to anger-provoking situations.  They identified a recent time they became angry and how they reacted.  They analyzed how their reaction was possibly beneficial and how it was possibly unhelpful.  The group discussed a variety of healthier coping skills that could help with such a situation in the future.  Focus was placed on how helpful it is to recognize the underlying emotions to our anger, because working on those can lead to a more permanent solution as well as our ability to focus on the important rather than the urgent.  Therapeutic Goals: Patients will remember their last incident of anger and how they felt emotionally and physically, what their thoughts were at the time, and how they behaved. Patients will identify how their behavior at that time worked for them, as well as how it worked against them. Patients will explore possible new behaviors to use in future anger situations. Patients will learn that anger itself is normal and cannot be eliminated, and that healthier reactions can assist with resolving conflict rather than worsening situations.  Summary of Patient Progress:  The patient was invited to group, did not attend.  Therapeutic Modalities:   Cognitive Behavioral Therapy  Lynnell Chad

## 2021-06-12 NOTE — Progress Notes (Addendum)
Admission Note  25 yr old female was involuntarily admitted to [facility]. Pt appears depressed exhibiting crying spells through out the duration of the admission. Pt was alert and oriented during assessment. She denies SI/HI/AVH and pain during assessment. Pt signed consents and cooperate with the nursing assessment. Pt is upset, appeared frustrated after learning more about the IVC process. VS were taken and WNL. Pt was informed of the IVC process, acute hospitalization stabilization and oriented to the unit. Skin assessment was completed and no significant findings were evident nor was contraband found. Pt belongings were secured in locker 34 by security as witnessed by pt. Pt denies further questions at this time. Will continue to monitor and assess.   Safety maintained
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
## 2021-06-13 DIAGNOSIS — F332 Major depressive disorder, recurrent severe without psychotic features: Secondary | ICD-10-CM

## 2021-06-13 DIAGNOSIS — F1020 Alcohol dependence, uncomplicated: Secondary | ICD-10-CM

## 2021-06-13 MED ORDER — HYDROXYZINE HCL 25 MG PO TABS: 25.0000 mg | ORAL_TABLET | Freq: Three times a day (TID) | ORAL | 0 refills | Status: AC | PRN

## 2021-06-13 MED ORDER — DOXEPIN HCL 25 MG PO CAPS: 25.0000 mg | ORAL_CAPSULE | Freq: Every evening | ORAL | 0 refills | Status: AC | PRN

## 2021-06-13 MED ORDER — FLUOXETINE HCL 20 MG PO CAPS: 20.0000 mg | ORAL_CAPSULE | Freq: Every day | ORAL | 0 refills | Status: AC

## 2021-06-13 NOTE — Progress Notes (Signed)
  [facility] Adult Case Management Discharge Plan :  Will you be returning to the same living situation after discharge:  Yes,  lives independently, will go home with mother as a support person.  At discharge, do you have transportation home?: Yes,  Mother will be picking patient up. Do you have the ability to pay for your medications: Yes,  insurance   Release of information consent forms completed and in the chart;  Patient's signature needed at discharge.  Patient to Follow up at:  Follow-up Information     [facility]. Call.   Why: Social Worker will put in a referral for their Substance Use Intensive Oupatient Program.  You may call to follow up with getting set up.  They should also reach out and to set up with you all.  Please call Geza Beranek, Child psychotherapist at [facility] [employer], with any issues.  [phone]. Contact information: [phone]  [address] [place], [place] [address]                Next level of care provider has access to [facility] Link:no  Safety Planning and Suicide Prevention discussed: Yes,  Mother, Haden Cavenaugh.     Has patient been referred to the Quitline?: Patient refused referral  Patient has been referred for addiction treatment: Yes, [facility] at [facility] Kaylla Cobos, LCSW 06/13/2021, 9:56 AM

## 2021-06-13 NOTE — [facility] Group Notes (Signed)
[facility] LCSW Group Therapy Note  06/13/2021    Type of Therapy and Topic:  Group Therapy:  A Hero Worthy of Support  Participation Level:  Active   Description of Group:  Patients in this group were introduced to the concept that additional supports including self-support are an essential part of recovery.  Matching needs with supports to help fulfill those needs was explained.  Establishing boundaries that can gradually be increased or decreased was described, with patients giving their own examples of establishing appropriate boundaries in their lives.  A song entitled "My Own Hero" was played and a group discussion ensued in which patients stated it inspired them to help themselves in order to succeed, because other people cannot achieve their goals such as sobriety or stability for them.  A song was played called "I Am Enough" which led to a discussion about being willing to believe we are worth the effort of being a self-support.   Therapeutic Goals: 1)  demonstrate the importance of being a key part of one's own support system 2)  discuss various available supports 3)  encourage patient to use music as part of their self-support and focus on goals 4)  elicit ideas from patients about supports that need to be added   Summary of Patient Progress:  The patient expressed that her family, friends, dogs, and job are healthy supports while she herself is an unhealthy support because of the substances she uses.  She was very supportive of others, her roommate especially, and spoke insightfully.  Therapeutic Modalities:   Motivational Interviewing Activity  Lynnell Chad
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
## 2021-06-13 NOTE — [facility] Suicide Risk Assessment (Signed)
[facility] INPATIENT:  Family/Significant Other Suicide Prevention Education  Suicide Prevention Education:  Education Completed; Elina Streng, Mother  (name of family member/significant other) has been identified by the patient as the family member/significant other with whom the patient will be residing, and identified as the person(s) who will aid the patient in the event of a mental health crisis (suicidal ideations/suicide attempt).  With written consent from the patient, the family member/significant other has been provided the following suicide prevention education, prior to the and/or following the discharge of the patient.  Mother reports that she has searched patient house for alcohol bottles. CSW answered all support person's questions and how to best support patient. Mother reports that there are no weapons or firearms in the house.  Mother and patient requested that CSW follow up with about referral placed to [facility] during weekday hours.   The suicide prevention education provided includes the following: Suicide risk factors Suicide prevention and interventions National Suicide Hotline telephone number [facility] assessment telephone number [place] [facility] Emergency Assistance 911 [facility] and/or Residential Mobile Crisis Unit telephone number  Request made of family/significant other to: Remove weapons (e.g., guns, rifles, knives), all items previously/currently identified as safety concern.   Remove drugs/medications (over-the-counter, prescriptions, illicit drugs), all items previously/currently identified as a safety concern.  The family member/significant other verbalizes understanding of the suicide prevention education information provided.  The family member/significant other agrees to remove the items of safety concern listed above.  Neya Creegan E Inetta Dicke 06/13/2021, 9:45 AM
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
## 2021-06-13 NOTE — [facility] Suicide Risk Assessment (Signed)
[facility] Discharge Suicide Risk Assessment   Principal Problem: <principal problem not specified> Discharge Diagnoses: Active Problems:   Suicidal behavior   Total Time spent with patient: 15 minutes  Musculoskeletal: Strength & Muscle Tone: within normal limits Gait & Station: normal Patient leans: N/A  Psychiatric Specialty Exam  Presentation  General Appearance: Fairly Groomed  Eye Contact:Fair  Speech:Normal Rate  Speech Volume:Normal  Handedness:Right   Mood and Affect  Mood:Anxious; Depressed  Duration of Depression Symptoms: Greater than two weeks  Affect:Congruent   Thought Process  Thought Processes:Coherent  Descriptions of Associations:Circumstantial  Orientation:Full (Time, Place and Person)  Thought Content:Logical  History of Schizophrenia/Schizoaffective disorder:No  Duration of Psychotic Symptoms:No data recorded Hallucinations:Hallucinations: None  Ideas of Reference:None  Suicidal Thoughts:Suicidal Thoughts: No  Homicidal Thoughts:Homicidal Thoughts: No   Sensorium  Memory:Immediate Fair; Recent Fair; Remote Fair  Judgment:Fair  Insight:Fair   Executive Functions  Concentration:Good  Attention Span:Fair  Recall:Fair  Fund of Knowledge:Fair  Language:Fair   Psychomotor Activity  Psychomotor Activity:Psychomotor Activity: Increased   Assets  Assets:Desire for Improvement; Resilience   Sleep  Sleep: No data recorded  Physical Exam: Physical Exam Vitals and nursing note reviewed.  Constitutional:      Appearance: Normal appearance.  HENT:     Head: Normocephalic and atraumatic.  Pulmonary:     Effort: Pulmonary effort is normal.  Neurological:     General: No focal deficit present.     Mental Status: She is alert and oriented to person, place, and time.   Review of Systems  All other systems reviewed and are negative. Blood pressure 132/85, pulse (!) 102, temperature (!) 97.3 F (36.3 C), temperature  source Oral, resp. rate 17, height 5\' 10"  (1.778 m), weight 91.6 kg, SpO2 98 %. Body mass index is 28.98 kg/m.  Mental Status Per Nursing Assessment::   On Admission:  Suicidal ideation indicated by patient  Demographic Factors:  Caucasian and Living alone  Loss Factors: NA  Historical Factors: Impulsivity  Risk Reduction Factors:   Sense of responsibility to family, Employed, and Positive social support  Continued Clinical Symptoms:  Depression:   Comorbid alcohol abuse/dependence Impulsivity Alcohol/Substance Abuse/Dependencies  Cognitive Features That Contribute To Risk:  None    Suicide Risk:  Minimal: No identifiable suicidal ideation.  Patients presenting with no risk factors but with morbid ruminations; may be classified as minimal risk based on the severity of the depressive symptoms    Plan Of Care/Follow-up recommendations:  Activity:  ad lib  [phone], MD 06/13/2021, 9:14 AM

## 2021-06-13 NOTE — Progress Notes (Addendum)
   06/13/21 1000  Psych Admission Type (Psych Patients Only)  Admission Status Voluntary  Psychosocial Assessment  Patient Complaints None  Eye Contact Fair  Facial Expression Anxious  Affect Appropriate to circumstance  Speech Logical/coherent  Interaction Assertive  Motor Activity Fidgety  Appearance/Hygiene Unremarkable  Behavior Characteristics Cooperative;Appropriate to situation  Mood Anxious;Pleasant  Thought Process  Coherency WDL  Content WDL  Delusions None reported or observed  Perception WDL  Hallucination None reported or observed  Judgment Poor  Confusion None  Danger to Self  Current suicidal ideation? Denies  Self-Injurious Behavior No self-injurious ideation or behavior indicators observed or expressed   Agreement Not to Harm Self Yes  Description of Agreement verbal  Danger to Others  Danger to Others None reported or observed   D. Pt presented much brighter today- rated her depression, hopelessness and anxiety a 2/1/4, respectively, on her self inventory. Pt stated that her goal today was "to get back on track with my sobriety so that I can be a better version of myself for both me and others". And stated that she would accomplish this by "focus on not drinking and go to [employer]".   Pt currently denies withdrawal symptoms, SI/HI and AVH A. Labs and vitals monitored. Pt given and educated on medications. Pt supported emotionally and encouraged to express concerns and ask questions.   R. Pt remains safe with 15 minute checks. Will continue POC.

## 2021-06-13 NOTE — Progress Notes (Signed)
Pt discharged to lobby. Parents present to pick up patient. Pt was stable and appreciative at that time. All papers and prescriptions were given and valuables returned. Verbal understanding expressed. Denies SI/HI and A/VH. Pt given opportunity to express concerns and ask questions.

## 2021-11-14 ENCOUNTER — Ambulatory Visit (HOSPITAL_COMMUNITY)
Admission: EM | Admit: 2021-11-14 | Discharge: 2021-11-14 | Disposition: A | Discharge status: Home / Self Care | Payer: 59 | Attending: Psychiatry | Admitting: Psychiatry

## 2021-11-14 DIAGNOSIS — F1994 Other psychoactive substance use, unspecified with psychoactive substance-induced mood disorder: Secondary | ICD-10-CM

## 2021-11-14 DIAGNOSIS — F331 Major depressive disorder, recurrent, moderate: Secondary | ICD-10-CM | POA: Diagnosis not present

## 2021-11-14 NOTE — Discharge Instructions (Addendum)
Patient is instructed prior to discharge to: Take all medications as prescribed by his/her mental healthcare provider. Report any adverse effects and or reactions from the medicines to his/her outpatient provider promptly. Patient has been instructed & cautioned: To not engage in alcohol and or illegal drug use while on prescription medicines. In the event of worsening symptoms, patient is instructed to call the crisis hotline, 911 and or go to the nearest ED for appropriate evaluation and treatment of symptoms. To follow-up with his/her primary care provider for your other medical issues, concerns and or health care needs.  It is OK to take naltrexone during relapses; would worry if you take it while drinking heavily for prolonged periods of time. It is designed to make the alcohol less rewarding and the relapses shorter.

## 2021-11-14 NOTE — ED Provider Notes (Signed)
Behavioral Health Urgent Care Medical Screening Exam  Patient Name: Elizabeth Daugherty MRN: [id number] Date of Evaluation: 11/14/21 Chief Complaint:   Diagnosis:  Final diagnoses:  None    History of Present illness: Elizabeth Daugherty is a 26 y.o. female with a psychiatric history of depression, anxiety, and alcohol use disorder. She was recently hospitalized at the [facility] for 1 day in setting of significant alcohol use in July of this year.   Accompanied by dad who she consented to have stay with her through interview.   She reports that after that hospitalization, she was able to maintain complete sobriety for about 2 months at which point she relapsed... was drinking socially at first, then sometimes alone, and has been drinking ~4 days a week for the past month. She has not had any withdrawal symptoms other than vivid dreams, which are long-standing (since childhood) and anxiety. No tremor on my brief exam. Worth noting that she told me that she drank 1/2 bottle of wine this AM not 1/2 box. Triggers for this relapse include boyfriend of 2 months not being sober and having to put down her 69 y/o childhood dog. 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   Her stated mood is "ready to move on". She again denied SI, HI, AH/VH and stated she felt she could reach out to someone (likely parents who she will be staying with) if this changes. Reports she is sometimes afraid to sleep because of "crazy vivid dreams"; did not seem to be overtly trauma related.   Discussed safety plan (see [facility] note for more details) and return precautions in detail.   Has followup through [facility] (sp?) who prescribes her fluoxetine 40 mg, doxepin 25 mg, hydroxyzine 25 mg BID and naltrexone 50 mg. Has not been compliant with naltrexone lately, does not take it on days that she drinks. Denied any hx of abnormal LFTs. Discussed that it is  designed both to reduce cravings for EtOH and reduce euphoric effects of this medication (ie shorten intensity and duration of relapses) and discussed continuing to take this even when she is drinking (balanced against risk of liver injury); cautiously advised to stop after 1 week of heavy drinking. Also discussed possibility of taking 1 additional dose of hydroxyzine PRN during the day and talking to outpt prescriber about starting something like buspirone (as hydroxyzine more often rescue med than scheduled). Outpt team not aware of recent relapse and is seeing pt monthly; encouraged her to reach out to them to try to get more frequent therapy.   General pattern of extreme reactions to abandonment, intense/stormy relationships, unstable sense of self, impulsive and dangerous behaviors.   Psych ROS Depression + for disturbed sleep, anhedonia, significant guilt, poor energy/concentration. No SI.  Anxiety + largely for rumination  No AH/VH/thought insertion/thought projection/ideas of reference endorsed or elicited through interview  No current or historical sx c/w mania (will go up to 36 hours without sleep when afraid of vivid dreams but drinks to stay awake rather than truly reduced need for sleep)    Psychiatric Specialty Exam  Presentation  General Appearance:Appropriate for Environment; Casual  Eye Contact:Good  Speech:Normal Rate  Speech Volume:Normal  Handedness:Right   Mood and Affect  Mood:Euthymic  Affect:Appropriate; Congruent   Thought Process  Thought Processes:Coherent  Descriptions of Associations:Intact  Orientation:Full (Time, Place and Person)  Thought Content:Logical (  devoid of SI, HI, AH/VH. Future oriented.)  Diagnosis of Schizophrenia or Schizoaffective disorder in past: No   Hallucinations:None  Ideas of Reference:None  Suicidal Thoughts:No Without Intent; Without Plan; Without Means to Carry Out; Without Access to Means Without Intent; Without  Plan; Without Means to Carry Out; Without Access to Means  Homicidal Thoughts:No   Sensorium  Memory:Recent Good; Immediate Good; Remote Good  Judgment:Good  Insight:Good   Executive Functions  Concentration:Good  Attention Span:Good  Recall:Good  Fund of Knowledge:Good  Language:Good   Psychomotor Activity  Psychomotor Activity:Normal   Assets  Assets:Communication Skills; Desire for Improvement; Physical Health; Resilience; Social Support; Transportation; Housing   Sleep  Sleep:Poor (often drinks to avoid sleep)  Number of hours: 6.5   No data recorded  Physical Exam: Physical Exam ROS Blood pressure 129/86, pulse (!) 102, temperature 98.3 F (36.8 C), temperature source Oral, resp. rate 18, SpO2 96 %. There is no height or weight on file to calculate BMI.  Musculoskeletal: Strength & Muscle Tone: within normal limits Gait & Station: normal Patient leans: N/A   This is a pt with a psychiatric history of depression, anxiety, and alcohol use disorder. She was recently hospitalized at the [facility] for 1 day in setting of significant alcohol use in July of this year. There is some concern for cluster B personality pathology although this diagnosis is not typically made in the urgent care setting or in setting of recent relapse/losses. Currently she is denying SI, HI, AH/VH, is highly motivated and future oriented, and has a safety plan in place. We discussed need to increase outpt level of care (either more frequent therapy or IOP) to which she was agreeable and strategies to improve effect of outpt medications (namely compliance with outpt naltrexone and ability to take 1 additional hydroxyzine PRN). Inpatient psychiatric hospitalization is not the least restrictive environment which will meat Ms. Gamel's ongoing needs for psychiatric care and she was ultimately discharged with appropriate followup resources and return precautions.   [facility] MSE Discharge Disposition for  Follow up and Recommendations: Based on my evaluation the patient does not appear to have an emergency medical condition and can be discharged with resources and follow up care in outpatient services for Partial Hospitalization Program and Individual Therapy  Resources put in AVS; additionally discussed reaching out to current outpt team to discuss sooner and potentially more frequent appointments.   Garret Teale A Steen Bisig 11/14/2021, 2:57 PM

## 2021-11-14 NOTE — ED Notes (Signed)
Patient received After Visit Summary with community resources. Patient received all personal belongings back from [facility] locker. Patient denied SI, HI and AVH at time of discharge.

## 2021-11-14 NOTE — BH Assessment (Signed)
Comprehensive Clinical Assessment (CCA) Note  11/14/2021 Elizabeth Daugherty [id number]  Disposition:  Gave clinical disposition to M. Cinderella, MD, who determined that Pt is psych-cleared with follow-up to her provider.  The patient demonstrates the following risk factors for suicide: Chronic risk factors for suicide include: psychiatric disorder of MDD and substance use disorder. Acute risk factors for suicide include: social withdrawal/isolation and loss (financial, interpersonal, professional). Protective factors for this patient include: positive social support, positive therapeutic relationship, and responsibility to others (children, family). Considering these factors, the overall suicide risk at this point appears to be low. Patient is appropriate for outpatient follow up.   Flowsheet Row Admission (Discharged) from 06/12/2021 in [place] [facility] Most recent reading at 06/12/2021  8:00 AM ED from 06/12/2021 in [place] DEPT Most recent reading at 06/12/2021  2:18 AM ED from 06/11/2021 in [facility] Most recent reading at 06/11/2021 11:48 PM  C-SSRS RISK CATEGORY No Risk No Risk High Risk       Chief Complaint:  Chief Complaint  Patient presents with   Depression    Pt has a history of depression, anxiety, suicidal ideation, and alcohol use.  Currently depressed due to recent life stressors:  Dog died; conflict with current boyfriend; may be losing her job because she is unable to work.   Visit Diagnosis: MDD, Recurrent, Moderate;    Narrative:  Pt is a 26 year old who presented to [facility] as a voluntary walk-in with complaint of despondency and recent alcohol and marijuana use.  Pt lives in [place] with two dogs, and she is recently unemployed.  Pt receives outpatient therapy and psychiatry services through [facility].  She is treated for depression and alcohol abuse.  Pt was last assessed by TTS in  July 2022.  At that time, she was treated inpatient for suicidal ideation.  Pt stated that she came to [facility] today because she is depressed and anxious due to several psychosocial stressors -- her 11 year old dog died two weeks ago; she is concerned about her new boyfriend's alcohol use; and she is concerned that she has lost her job because she was too upset to go to work due to the passing of her dog.  Pt also reported that over the last 24 hours, she ingested a box of wine and also 4 to 5 bowls of marijuana.  Pt's last use was today.  Pt endorsed despondency, tearfulness, poor sleep, hopelessness and worthlessness.  Pt denied recent suicidal ideation, homicidal ideation, self-injurious behavior, and hallucination.  She denied a history of trauma, and she said that she feels well-supported by parents and therapy services.  When asked what help Pt wanted today, she said that she wanted to talk to someone so she could ''come down,'' meaning regain her composure.  Pt said that she felt safe to go home if she could stay with her parents.  During assessment, Pt presented as alert and oriented.  She had good eye contact and was cooperative.  Pt was dressed in street clothes, and she appeared appropriately groomed.  Mood was depressed.  Affect was tearful.  Pt's speech was normal in rate, rhythm, and volume.  Thought processes were within normal range, and thought content was logical and goal-oriented.  There was no evidence and delusion.  Memory and concentration were intact.  Insight, judgment, and impulse control were fair.  CCA Screening, Triage and Referral (STR)  Patient Reported Information How did you  hear about [place]? Self (Here twice before)  What Is the Reason for Your Visit/Call Today? Pt reports significant depresion due to recent passing of her 77 year old dog.  Despondent about new boyfriend's alcohol use. Also despondent because lost job.  How Long Has This Been Causing You Problems? > than 6  months (History of depression since 2017 (given dx))  What Do You Feel Would Help You the Most Today? Treatment for Depression or other mood problem; Stress Management; Medication(s)   Have You Recently Had Any Thoughts About Hurting Yourself? No  Are You Planning to Commit Suicide/Harm Yourself At This time? No   Have you Recently Had Thoughts About [place]? No  Are You Planning to Harm Someone at This Time? No  Explanation: No data recorded  Have You Used Any Alcohol or Drugs in the Past 24 Hours? Yes  How Long Ago Did You Use Drugs or Alcohol? No data recorded What Did You Use and How Much? Alcohol and THC -- ingested a box of wine and unknown quantity of weed   Do You Currently Have a Therapist/Psychiatrist? No (Pt's PCP prescribes  her medications.)  Name of Therapist/Psychiatrist: No data recorded  Have You Been Recently Discharged From Any Office Practice or Programs? No  Explanation of Discharge From Practice/Program: No data recorded    CCA Screening Triage Referral Assessment Type of Contact: Face-to-Face  Telemedicine Service Delivery:   Is this Initial or Reassessment? No data recorded Date Telepsych consult ordered in CH[facility]:  No data recorded Time Telepsych consult ordered in CHL:  No data recorded Location of Assessment: [facility] Services  Provider Location: [facility] Assessment Services   Collateral Involvement: Pritika Groh, mother, [phone].   Does Patient Have a Stage manager Guardian? No data recorded Name and Contact of Legal Guardian: No data recorded If Minor and Not Living with Parent(s), Who has Custody? No data recorded Is CPS involved or ever been involved? Never  Is APS involved or ever been involved? Never   Patient Determined To Be At Risk for Harm To Self or Others Based on Review of Patient Reported Information or Presenting Complaint? Yes, for Self-Harm  Method: No data recorded Availability  of Means: No data recorded Intent: No data recorded Notification Required: No data recorded Additional Information for Danger to Others Potential: No data recorded Additional Comments for Danger to Others Potential: No data recorded Are There Guns or Other Weapons in Your Home? No data recorded Types of Guns/Weapons: No data recorded Are These Weapons Safely Secured?                            No data recorded Who Could Verify You Are Able To Have These Secured: No data recorded Do You Have any Outstanding Charges, Pending Court Dates, Parole/Probation? No data recorded Contacted To Inform of Risk of Harm To Self or Others: Family/Significant Other:    Does Patient Present under Involuntary Commitment? No  IVC Papers Initial File Date: No data recorded  [place] of Residence: [place]   Patient Currently Receiving the Following Services: Not Receiving Services   Determination of Need: Urgent (48 hours)   Options For Referral: Medication Management; Outpatient Therapy; [facility]     CCA Biopsychosocial Patient Reported Schizophrenia/Schizoaffective Diagnosis in Past: No   Strengths: Family supports, communication, good insight   Mental Health Symptoms Depression:   Increase/decrease in appetite; Tearfulness; Change in energy/activity;  Worthlessness; Hopelessness   Duration of Depressive symptoms:  Duration of Depressive Symptoms: Greater than two weeks   Mania:   None   Anxiety:    Tension; Worrying; Sleep; Fatigue; Restlessness   Psychosis:   None   Duration of Psychotic symptoms:    Trauma:   None   Obsessions:   None   Compulsions:   None   Inattention:   None   Hyperactivity/Impulsivity:   None   Oppositional/Defiant Behaviors:   None   Emotional Irregularity:   Recurrent suicidal behaviors/gestures/threats   Other Mood/Personality Symptoms:   NA    Mental Status Exam Appearance and self-care  Stature:   Average   Weight:    Average weight   Clothing:   Casual   Grooming:   Normal   Cosmetic use:   None   Posture/gait:   Normal   Motor activity:   Not Remarkable   Sensorium  Attention:   Normal   Concentration:   Normal   Orientation:   X5   Recall/memory:   Normal   Affect and Mood  Affect:   Tearful   Mood:   Depressed   Relating  Eye contact:   Normal   Facial expression:   Depressed   Attitude toward examiner:   Cooperative   Thought and Language  Speech flow:  Normal   Thought content:   Appropriate to Mood and Circumstances   Preoccupation:   None   Hallucinations:   None   Organization:  No data recorded  [employer] of Knowledge:   Average   Intelligence:   Average   Abstraction:   Normal   Judgement:   Fair   Art therapist:   Realistic   Insight:   Fair   Decision Making:   Normal   Social Functioning  Social Maturity:   Responsible   Social Judgement:   Normal   Stress  Stressors:   Other (Comment) (Finding out her father cheated on her mother and having to tell her. Per pt, her dog that is 88 year old was diagnosed with bone cancern and it spread.)   Coping Ability:   Overwhelmed   Skill Deficits:   None   Supports:   Family; Friends/Service system     Religion: Religion/Spirituality Are You A Religious Person?: No  Leisure/Recreation: Leisure / Recreation Do You Have Hobbies?: Yes Leisure and Hobbies: Gardening, dogs  Exercise/Diet: Exercise/Diet Do You Exercise?: Yes What Type of Exercise Do You Do?: Weight Training, Run/Walk How Many Times a Week Do You Exercise?: 1-3 times a week Have You Gained or Lost A Significant Amount of Weight in the Past Six Months?: No Do You Follow a Special Diet?: Yes Type of Diet: Vegetarian Do You Have Any Trouble Sleeping?: Yes Explanation of Sleeping Difficulties: Pt reports, trouble sleeping, staying asleep, waking up.   CCA  Employment/Education Employment/Work Situation: Employment / Work Situation Employment Situation: Unemployed Patient's Job has Been Impacted by Current Illness: Yes Describe how Patient's Job has Been Impacted: Pt stated that she may have lost her job because she could not attend due to grief Has Patient ever Been in the [employer]?: No  Education: Education Is Patient Currently Attending School?: No Last Grade Completed: 12 Did You Nutritional therapist?: Yes What Type of College Degree Do you Have?: Some college Did You Have An Individualized Education Program (IIEP): No Did You Have Any Difficulty At School?: No Patient's Education Has Been Impacted by Current Illness: No  CCA Family/Childhood History Family and Relationship History: Family history Marital status: Single Does patient have children?: No  Childhood History:  Childhood History By whom was/is the patient raised?: Both parents Did patient suffer any verbal/emotional/physical/sexual abuse as a child?: No Did patient suffer from severe childhood neglect?: No Has patient ever been sexually abused/assaulted/raped as an adolescent or adult?: No Was the patient ever a victim of a crime or a disaster?: No Witnessed domestic violence?: No Has patient been affected by domestic violence as an adult?: No  Child/Adolescent Assessment:     CCA Substance Use Alcohol/Drug Use: Alcohol / Drug Use Pain Medications: Please see MAR Prescriptions: Please see MAR Over the Counter: Please see MAR History of alcohol / drug use?: Yes Longest period of sobriety (when/how long): June-October 2022 Substance #1 Name of Substance 1: Alcohol 1 - Age of First Use: 13 1 - Amount (size/oz): two bottles of wine up to a box of wine 1 - Frequency: Weekly, up to five nights per week 1 - Duration: Ongoing 1 - Last Use / Amount: 11/14/2021 -- varied 1 - Method of Aquiring: Purchase 1- Route of Use: Oral ingestion Substance #2 Name of Substance  2: THC 2 - Age of First Use: 15 2 - Amount (size/oz): 4-5 bowls 2 - Frequency: Daily 2 - Duration: Ongoing 2 - Last Use / Amount: 11/14/2021 2 - Method of Aquiring: Street purchase 2 - Route of Substance Use: Oral inhalation                     ASAM's:  Six Dimensions of Multidimensional Assessment  Dimension 1:  Acute Intoxication and/or Withdrawal Potential:   Dimension 1:  Description of individual's past and current experiences of substance use and withdrawal: Pt reports daily alcohol and marijuana use. Denies current withdrawal symptoms.  Dimension 2:  Biomedical Conditions and Complications:   Dimension 2:  Description of patient's biomedical conditions and  complications: None  Dimension 3:  Emotional, Behavioral, or Cognitive Conditions and Complications:  Dimension 3:  Description of emotional, behavioral, or cognitive conditions and complications: Pt has history of depression and anxiety.  Dimension 4:  Readiness to Change:  Dimension 4:  Description of Readiness to Change criteria: Initally, pt expressed wanting help however currently pt is going back and forth of wanting to stay and wanting to go home.  Dimension 5:  Relapse, Continued use, or Continued Problem Potential:  Dimension 5:  Relapse, continued use, or continued problem potential critiera description: Pt reports, drinking alochol and smoking marijuna daily.  Dimension 6:  Recovery/Living Environment:  Dimension 6:  Recovery/Iiving environment criteria description: Pt lives alone  ASAM Severity Score: ASAM's Severity Rating Score: 9  ASAM Recommended Level of Treatment: ASAM Recommended Level of Treatment: Level II Intensive Outpatient Treatment   Substance use Disorder (SUD) Substance Use Disorder (SUD)  Checklist Symptoms of Substance Use: Continued use despite having a persistent/recurrent physical/psychological problem caused/exacerbated by use  Recommendations for  Services/Supports/Treatments: Recommendations for Services/Supports/Treatments Recommendations For Services/Supports/Treatments: SAIOP (Substance Abuse Intensive Outpatient Program)  Discharge Disposition:    DSM5 Diagnoses: Patient Active Problem List   Diagnosis Date Noted   Major depressive disorder, recurrent episode, moderate (HCC)    Major depressive disorder, recurrent episode, severe ([place]) 06/13/2021   Alcohol use disorder, severe, dependence ([place]) 06/13/2021   Suicidal behavior 06/12/2021     Referrals to Alternative Service(s): Referred to Alternative Service(s):   Place:   Date:   Time:    Referred to  Alternative Service(s):   Place:   Date:   Time:    Referred to Alternative Service(s):   Place:   Date:   Time:    Referred to Alternative Service(s):   Place:   Date:   Time:     Marlowe Aschoff, [facility]

## 2021-11-23 ENCOUNTER — Telehealth (HOSPITAL_COMMUNITY): Payer: Self-pay | Admitting: Professional

## 2022-01-01 ENCOUNTER — Ambulatory Visit (HOSPITAL_COMMUNITY)
Admission: EM | Admit: 2022-01-01 | Discharge: 2022-01-02 | Disposition: A | Discharge status: Home / Self Care | Payer: 59 | Attending: Nurse Practitioner | Admitting: Nurse Practitioner

## 2022-01-01 DIAGNOSIS — F1020 Alcohol dependence, uncomplicated: Secondary | ICD-10-CM | POA: Diagnosis not present

## 2022-01-01 DIAGNOSIS — F331 Major depressive disorder, recurrent, moderate: Secondary | ICD-10-CM | POA: Insufficient documentation

## 2022-01-01 DIAGNOSIS — Z20822 Contact with and (suspected) exposure to covid-19: Secondary | ICD-10-CM | POA: Insufficient documentation

## 2022-01-01 DIAGNOSIS — F419 Anxiety disorder, unspecified: Secondary | ICD-10-CM | POA: Insufficient documentation

## 2022-01-01 MED ORDER — ALUM & MAG HYDROXIDE-SIMETH 200-200-20 MG/5ML PO SUSP: 30.0000 mL | ORAL | Status: DC | PRN

## 2022-01-01 MED ORDER — NICOTINE POLACRILEX 2 MG MT GUM: 2.0000 mg | CHEWING_GUM | OROMUCOSAL | Status: DC

## 2022-01-01 MED ORDER — MAGNESIUM HYDROXIDE 400 MG/5ML PO SUSP: 30.0000 mL | Freq: Every day | ORAL | Status: DC | PRN

## 2022-01-01 MED ORDER — ACETAMINOPHEN 325 MG PO TABS: 650.0000 mg | ORAL_TABLET | Freq: Four times a day (QID) | ORAL | Status: DC | PRN

## 2022-01-01 MED ORDER — HYDROXYZINE HCL 25 MG PO TABS: 25.0000 mg | ORAL_TABLET | Freq: Three times a day (TID) | ORAL | Status: DC | PRN

## 2022-01-01 MED ORDER — TRAZODONE HCL 50 MG PO TABS: 50.0000 mg | ORAL_TABLET | Freq: Every evening | ORAL | Status: DC | PRN

## 2022-01-02 DIAGNOSIS — Z20822 Contact with and (suspected) exposure to covid-19: Secondary | ICD-10-CM | POA: Diagnosis not present

## 2022-01-02 DIAGNOSIS — F331 Major depressive disorder, recurrent, moderate: Secondary | ICD-10-CM | POA: Diagnosis not present

## 2022-01-02 DIAGNOSIS — F1020 Alcohol dependence, uncomplicated: Secondary | ICD-10-CM | POA: Diagnosis not present

## 2022-01-02 DIAGNOSIS — F419 Anxiety disorder, unspecified: Secondary | ICD-10-CM | POA: Diagnosis not present

## 2022-01-02 LAB — CBC WITH DIFFERENTIAL/PLATELET
Abs Immature Granulocytes: 0.02 10*3/uL (ref 0.00–0.07)
Basophils Absolute: 0.1 10*3/uL (ref 0.0–0.1)
Basophils Relative: 1 %
Eosinophils Absolute: 0.1 10*3/uL (ref 0.0–0.5)
Eosinophils Relative: 2 %
HCT: 39.3 % (ref 36.0–46.0)
Hemoglobin: 13.3 g/dL (ref 12.0–15.0)
Immature Granulocytes: 0 %
Lymphocytes Relative: 43 %
Lymphs Abs: 3.6 10*3/uL (ref 0.7–4.0)
MCH: 30.5 pg (ref 26.0–34.0)
MCHC: 33.8 g/dL (ref 30.0–36.0)
MCV: 90.1 fL (ref 80.0–100.0)
Monocytes Absolute: 0.7 10*3/uL (ref 0.1–1.0)
Monocytes Relative: 8 %
Neutro Abs: 3.9 10*3/uL (ref 1.7–7.7)
Neutrophils Relative %: 46 %
Platelets: 301 10*3/uL (ref 150–400)
RBC: 4.36 MIL/uL (ref 3.87–5.11)
RDW: 12.7 % (ref 11.5–15.5)
WBC: 8.4 10*3/uL (ref 4.0–10.5)
nRBC: 0 % (ref 0.0–0.2)

## 2022-01-02 LAB — COMPREHENSIVE METABOLIC PANEL
ALT: 23 U/L (ref 0–44)
AST: 41 U/L (ref 15–41)
Albumin: 4.5 g/dL (ref 3.5–5.0)
Alkaline Phosphatase: 65 U/L (ref 38–126)
Anion gap: 11 (ref 5–15)
BUN: 7 mg/dL (ref 6–20)
CO2: 23 mmol/L (ref 22–32)
Calcium: 9.1 mg/dL (ref 8.9–10.3)
Chloride: 105 mmol/L (ref 98–111)
Creatinine, Ser: 0.59 mg/dL (ref 0.44–1.00)
GFR, Estimated: >60 mL/min (ref >60)
Glucose, Bld: 77 mg/dL (ref 70–99)
Potassium: 3.7 mmol/L (ref 3.5–5.1)
Sodium: 139 mmol/L (ref 135–145)
Total Bilirubin: 0.5 mg/dL (ref 0.3–1.2)
Total Protein: 8 g/dL (ref 6.5–8.1)

## 2022-01-02 LAB — POC SARS CORONAVIRUS 2 AG -  ED: SARS Coronavirus 2 Ag: NEGATIVE

## 2022-01-02 LAB — LIPID PANEL
Cholesterol: 270 mg/dL — ABNORMAL HIGH (ref 0–200)
HDL: 80 mg/dL (ref >40)
LDL Cholesterol: 113 mg/dL — ABNORMAL HIGH (ref 0–99)
Total CHOL/HDL Ratio: 3.4 RATIO
Triglycerides: 386 mg/dL — ABNORMAL HIGH (ref <150)
VLDL: 77 mg/dL — ABNORMAL HIGH (ref 0–40)

## 2022-01-02 LAB — POCT PREGNANCY, URINE: Preg Test, Ur: NEGATIVE

## 2022-01-02 LAB — RESP PANEL BY RT-PCR (FLU A&B, COVID) ARPGX2
Influenza A by PCR: NEGATIVE
Influenza B by PCR: NEGATIVE
SARS Coronavirus 2 by RT PCR: NEGATIVE

## 2022-01-02 LAB — ETHANOL: Alcohol, Ethyl (B): 275 mg/dL — ABNORMAL HIGH (ref <10)

## 2022-01-02 LAB — POCT URINE DRUG SCREEN - MANUAL ENTRY (I-SCREEN)
POC Amphetamine UR: NOT DETECTED
POC Buprenorphine (BUP): NOT DETECTED
POC Cocaine UR: NOT DETECTED
POC Marijuana UR: POSITIVE — AB
POC Methadone UR: NOT DETECTED
POC Methamphetamine UR: NOT DETECTED
POC Morphine: NOT DETECTED
POC Oxazepam (BZO): NOT DETECTED
POC Oxycodone UR: NOT DETECTED
POC Secobarbital (BAR): NOT DETECTED

## 2022-01-02 LAB — HEMOGLOBIN A1C
Hgb A1c MFr Bld: 4.6 % — ABNORMAL LOW (ref 4.8–5.6)
Mean Plasma Glucose: 85.32 mg/dL

## 2022-01-02 LAB — TSH: TSH: 3.937 u[IU]/mL (ref 0.350–4.500)

## 2022-01-02 NOTE — ED Notes (Addendum)
Pt admitted to [facility] endorsing SI, worsening depression. Patient was cooperative during the admission assessment. Pt denied SI, HI, AVH at present.Skin assessment complete. Patient oriented to unit and unit rules. Meal and drinks offered to patient.  Pt stated she only wanted water. Patient verbalized agreement to treatment plans. Patient verbally contracts for safety while hospitalized. Will monitor for safety.

## 2022-01-02 NOTE — BH Assessment (Signed)
Comprehensive Clinical Assessment (CCA) Note  01/02/2022 Elizabeth [facility] [id number]  Disposition: Elizabeth Gum, NP, patient meets inpatient criteria for [facility].   The patient demonstrates the following risk factors for suicide: Chronic risk factors for suicide include: psychiatric disorder of MDD and substance use disorder. Acute risk factors for suicide include: social withdrawal/isolation and loss (financial, interpersonal, professional). Protective factors for this patient include: positive social support, positive therapeutic relationship, and responsibility to others (children, family). Considering these factors, the overall suicide risk at this point appears to be low. Patient is appropriate for outpatient follow up.   [place] ED from 01/01/2022 in [facility] Most recent reading at 01/02/2022  2:02 AM Admission (Discharged) from 06/12/2021 in [facility] Most recent reading at 06/12/2021  8:00 AM ED from 06/12/2021 in [place] DEPT Most recent reading at 06/12/2021  2:18 AM  C-SSRS RISK CATEGORY Error: Question 6 not populated No Risk No Risk      Elizabeth Daugherty is a 26 year old female presenting as a voluntary walk-in due to alcohol and marijuana abuse. Patient is requesting substance abuse treatment. Patient reports that she has been acting out of character, such as lying, stealing from friends and stores. Patient reports drinking 2-3 bottles of wine daily and smoking 1 or 2 bowls of marijuana daily. Patient reported SI and that she sleeps with knife under bed and that one time she did place knife on her skin. Patient reported onset of SI was 10 years ago and has been on and off, with this episode starting the past summer. Then, later, patient stated every time I come here I am turned away, so I was told to admit to SI so that I would get a bed. Patient reported stressors include alcohol usage, dog of  15 years died in Nov 09, 2021, which is the same time that patient and her boyfriend had problems. Patient currently receives outpatient [facility] and is being treated for depression and alcohol abuse. Patient was last assessed on 11/14/21. Patient requesting inpatient treatment. Patient cooperative during assessment.  Chief Complaint:  Chief Complaint  Patient presents with   Suicidal   Visit Diagnosis:  MDD, Recurrent, Moderate  CCA Screening, Triage and Referral (STR)  Patient Reported Information How did you hear about [place]? Family/Friend  What Is the Reason for Your Visit/Call Today? Elizabeth Daugherty is a 26 year old female presenting as a voluntary walk-in due to alcohol and marijuana abuse. Patient is requesting substance abuse treatment. Patient reports that she has been acting out of character, such as lying, stealing from friends and stores. Patient reports drinking 2-3 bottles of wine daily and smoking 1 or 2 bowls of marijuana daily. Patient reported SI and that she sleeps with knife under bed and that one time she did place knife on her skin. Patient reported onset of SI was 10 years ago and has been on and off, with this episode starting the past summer. Then, later, patient stated every time I come here I am turned away, so I was told to admit to SI so that I would get a bed. Patient reported stressors include alcohol usage, dog of 15 years died in 2021/11/09, which is the same time that patient and her boyfriend had problems. Patient currently receives outpatient [facility] and is being treated for depression and alcohol abuse. Patient was last assessed on 11/14/21. Patient requesting inpatient treatment. Patient cooperative during assessment.  How Long  Has This Been Causing You Problems? > than 6 months  What Do You Feel Would Help You the Most Today? Alcohol or Drug Use Treatment   Have You Recently Had Any Thoughts About Hurting  Yourself? No  Are You Planning to Commit Suicide/Harm Yourself At This time? No   Have you Recently Had Thoughts About [place]? No  Are You Planning to Harm Someone at This Time? No  Explanation: No data recorded  Have You Used Any Alcohol or Drugs in the Past 24 Hours? Yes  How Long Ago Did You Use Drugs or Alcohol? No data recorded What Did You Use and How Much? 2-3 bottles of wine   Do You Currently Have a Therapist/Psychiatrist? Yes  Name of Therapist/Psychiatrist: [place] Recently Discharged From Any Mudlogger or Programs? No  Explanation of Discharge From Practice/Program: No data recorded    CCA Screening Triage Referral Assessment Type of Contact: Face-to-Face  Telemedicine Service Delivery:   Is this Initial or Reassessment? No data recorded Date Telepsych consult ordered in [facility]:  No data recorded Time Telepsych consult ordered in [facility]:  No data recorded [facility]  Provider Location: [facility] Assessment [facility]   Collateral Involvement: Elizabeth D[facility], mother, [phone].   Does Patient Have a Stage manager Guardian? No data recorded Name and Contact of Legal Guardian: No data recorded If Minor and Not Living with Parent(s), Who has Custody? No data recorded Is CPS involved or ever been involved? Never  Is APS involved or ever been involved? Never   Patient Determined To Be At Risk for Harm To Self or Others Based on Review of Patient Reported Information or Presenting Complaint? Yes, for Self-Harm  Method: No data recorded Availability of Means: No data recorded Intent: No data recorded Notification Required: No data recorded Additional Information for Danger to Others Potential: No data recorded Additional Comments for Danger to Others Potential: No data recorded Are There Guns or Other Weapons in Your Home? No data recorded Types of  Guns/Weapons: No data recorded Are These Weapons Safely Secured?                            No data recorded Who Could Verify You Are Able To Have These Secured: No data recorded Do You Have any Outstanding Charges, Pending Court Dates, Parole/Probation? No data recorded Contacted To Inform of Risk of Harm To Self or Others: Family/Significant Other:    Does Patient Present under Involuntary Commitment? No  IVC Papers Initial File Date: No data recorded  [place] of Residence: [place]   Patient Currently Receiving the Following [facility]: Medication Management; Individual Therapy   Determination of Need: Urgent (48 hours)   Options For Referral: Chemical Dependency Intensive Outpatient Therapy (CDIOP); Facility-Based Crisis; Medication Management; Outpatient Therapy     CCA Biopsychosocial Patient Reported Schizophrenia/Schizoaffective Diagnosis in Past: No   Strengths: Family supports, communication, good insight   Mental Health Symptoms Depression:   Increase/decrease in appetite; Tearfulness; Change in energy/activity; Worthlessness; Hopelessness; Fatigue   Duration of Depressive symptoms:  Duration of Depressive Symptoms: Greater than two weeks   Mania:   None   Anxiety:    Tension; Worrying; Sleep; Fatigue; Restlessness   Psychosis:   None   Duration of Psychotic symptoms:    Trauma:   None   Obsessions:   None   Compulsions:   None  Inattention:   None   Hyperactivity/Impulsivity:   None   Oppositional/Defiant Behaviors:   None   Emotional Irregularity:   Recurrent suicidal behaviors/gestures/threats   Other Mood/Personality Symptoms:   NA    Mental Status Exam Appearance and self-care  Stature:   Average   Weight:   Average weight   Clothing:   Casual   Grooming:   Normal   Cosmetic use:   None   Posture/gait:   Normal   Motor activity:   Not Remarkable   Sensorium  Attention:   Normal   Concentration:    Normal   Orientation:   X5   Recall/memory:   Normal   Affect and Mood  Affect:   Tearful   Mood:   Depressed   Relating  Eye contact:   Normal   Facial expression:   Depressed   Attitude toward examiner:   Cooperative   Thought and Language  Speech flow:  Normal   Thought content:   Appropriate to Mood and Circumstances   Preoccupation:   None   Hallucinations:   None   Organization:  No data recorded  [employer] of Knowledge:   Average   Intelligence:   Average   Abstraction:   Normal   Judgement:   Fair   Art therapist:   Realistic   Insight:   Fair   Decision Making:   Normal   Social Functioning  Social Maturity:   Responsible   Social Judgement:   Normal   Stress  Stressors:   Other (Comment) (Finding out her father cheated on her mother and having to tell her. Per pt, her dog that is 60 year old was diagnosed with bone cancern and it spread and dog died. Relationship with boyfriend.)   Coping Ability:   Overwhelmed   Skill Deficits:   None   Supports:   Family; Friends/Service system     Religion: Religion/Spirituality Are You A Religious Person?: No How Might This Affect Treatment?: NA  Leisure/Recreation: Leisure / Recreation Do You Have Hobbies?: Yes Leisure and Hobbies: Gardening, dogs  Exercise/Diet: Exercise/Diet Do You Exercise?: Yes What Type of Exercise Do You Do?: Weight Training, Run/Walk How Many Times a Week Do You Exercise?: 1-3 times a week Have You Gained or Lost A Significant Amount of Weight in the Past Six Months?: No Do You Follow a Special Diet?: Yes Type of Diet: Vegetarian Do You Have Any Trouble Sleeping?: Yes Explanation of Sleeping Difficulties: Pt reports, trouble sleeping, staying asleep, waking up.   CCA Employment/Education Employment/Work Situation: Employment / Work Situation Employment Situation: Unemployed Patient's Job has Been Impacted by Current  Illness: Yes Describe how Patient's Job has Been Impacted: Pt stated that she may have lost her job because she could not attend due to grief Has Patient ever Been in the [employer]?: No  Education: Education Last Grade Completed: 12 Did [place]?: Yes What Type of College Degree Do you Have?: Some college Did You Have An Individualized Education Program (IIEP): No Did You Have Any Difficulty At School?: No   CCA Family/Childhood History Family and Relationship History: Family history Marital status: Single Does patient have children?: No  Childhood History:  Childhood History By whom was/is the patient raised?: Both parents Did patient suffer any verbal/emotional/physical/sexual abuse as a child?: No Has patient ever been sexually abused/assaulted/raped as an adolescent or adult?: No Witnessed domestic violence?: No Has patient been affected by domestic violence as an adult?: No  Child/Adolescent Assessment:     CCA Substance Use Alcohol/Drug Use: Alcohol / Drug Use Pain Medications: Please see MAR Prescriptions: Please see MAR Over the Counter: Please see MAR History of alcohol / drug use?: Yes Longest period of sobriety (when/how long): June-October 2022 Negative Consequences of Use:  (Pt denies) Withdrawal Symptoms:  (Pt denies) Substance #1 Name of Substance 1: alcohol 1 - Age of First Use: 16 1 - Amount (size/oz): 2-3 bottles 1 - Frequency: daily 1 - Duration: uta 1 - Last Use / Amount: today 1 - Method of Aquiring: uta 1- Route of Use: oral Substance #2 Name of Substance 2: marijuana 2 - Age of First Use: 15 2 - Amount (size/oz): 4-5 bowls 2 - Frequency: Daily 2 - Duration: Ongoing 2 - Last Use / Amount: 11/14/2021 2 - Method of Aquiring: uta 2 - Route of Substance Use: smoke                     ASAM's:  Six Dimensions of Multidimensional Assessment  Dimension 1:  Acute Intoxication and/or Withdrawal Potential:   Dimension 1:   Description of individual's past and current experiences of substance use and withdrawal: Pt reports daily alcohol and marijuana use. Denies current withdrawal symptoms.  Dimension 2:  Biomedical Conditions and Complications:   Dimension 2:  Description of patient's biomedical conditions and  complications: None  Dimension 3:  Emotional, Behavioral, or Cognitive Conditions and Complications:  Dimension 3:  Description of emotional, behavioral, or cognitive conditions and complications: Pt has history of depression and anxiety.  Dimension 4:  Readiness to Change:  Dimension 4:  Description of Readiness to Change criteria: Initally, pt expressed wanting help however currently pt is going back and forth of wanting to stay and wanting to go home.  Dimension 5:  Relapse, Continued use, or Continued Problem Potential:  Dimension 5:  Relapse, continued use, or continued problem potential critiera description: Pt reports, drinking alochol and smoking marijuna daily.  Dimension 6:  Recovery/Living Environment:  Dimension 6:  Recovery/Iiving environment criteria description: Pt lives alone  ASAM Severity Score: ASAM's Severity Rating Score: 9  ASAM Recommended Level of Treatment: ASAM Recommended Level of Treatment: Level II Intensive Outpatient Treatment   Substance use Disorder (SUD) Substance Use Disorder (SUD)  Checklist Symptoms of Substance Use: Continued use despite having a persistent/recurrent physical/psychological problem caused/exacerbated by use  Recommendations for [facility]/Supports/Treatments: Recommendations for [facility]/Supports/Treatments Recommendations For [facility]/Supports/Treatments: SAIOP (Substance Abuse Intensive Outpatient Program)  Discharge Disposition:    DSM5 Diagnoses: Patient Active Problem List   Diagnosis Date Noted   Major depressive disorder, recurrent episode, moderate (HCC)    Major depressive disorder, recurrent episode, severe ([place]) 06/13/2021   Alcohol use  disorder, severe, dependence ([place]) 06/13/2021   Suicidal behavior 06/12/2021     Referrals to Alternative Service(s): Referred to Alternative Service(s):   Place:   Date:   Time:    Referred to Alternative Service(s):   Place:   Date:   Time:    Referred to Alternative Service(s):   Place:   Date:   Time:    Referred to Alternative Service(s):   Place:   Date:   Time:     Venora Maples, [facility]

## 2022-01-02 NOTE — ED Notes (Signed)
Pt came to nurse and stated she will like to leave. Provider notified. Provider talking pt. Will continue to monitor for safety.

## 2022-01-02 NOTE — Progress Notes (Signed)
°   01/01/22 2307  [place] (Walk-ins at [facility] only)  How Did You Hear About [place]? Family/Friend  What Is the Reason for Your Visit/Call Today? Elizabeth Daugherty is a 26 year old female presenting as a voluntary walk-in due to alcohol and marijuana abuse. Patient is requesting substance abuse treatment. Patient reports that she has been acting out of character, such as lying, stealing from friends and stores. Patient reports drinking 2-3 bottles of wine daily and smoking 1 or 2 bowls of marijuana daily. Patient reported SI and that she sleeps with knife under bed and that one time she did place knife on her skin. Patient reported onset of SI was 10 years ago and has been on and off, with this episode starting the past summer. Then, later, patient stated every time I come here I am turned away, so I was told to admit to SI so that I would get a bed. Patient reported stressors include alcohol usage, dog of 15 years died in 2021/11/12, which is the same time that patient and her boyfriend had problems. Patient currently receives outpatient therapy services through [facility] and is being treated for depression and alcohol abuse. Patient was last assessed on 11/14/21. Patient requesting inpatient treatment. Patient cooperative during assessment.  How Long Has This Been Causing You Problems? > than 6 months  Have You Recently Had Any Thoughts About Hurting Yourself? No  Are You Planning to Commit Suicide/Harm Yourself At This time? No  Have you Recently Had Thoughts About [place]? No  Are You Planning To Harm Someone At This Time? No  Are you currently experiencing any auditory, visual or other hallucinations? No  Have You Used Any Alcohol or Drugs in the Past 24 Hours? Yes  How long ago did you use Drugs or Alcohol? today  What Did You Use and How Much? 2-3 bottles of wine  Do you have any current medical co-morbidities that require immediate attention? No  Clinician description of  patient physical appearance/behavior: casual / cooperative  What Do You Feel Would Help You the Most Today? Alcohol or Drug Use Treatment  If access to [facility] was not available, would you have sought care in the Emergency Department? Yes  Determination of Need Urgent (48 hours)  Options For Referral Chemical Dependency Intensive Outpatient Therapy (CDIOP);Facility-Based Crisis;Medication Management;Outpatient Therapy

## 2022-01-02 NOTE — ED Notes (Signed)
Pt signed AMA form. Patient escorted to lobby via staff in no acute distress. Safety maintained.

## 2022-01-02 NOTE — ED Notes (Signed)
Provider made aware patient is wanting to leave and has come to see patient

## 2022-01-03 LAB — POCT PREGNANCY, URINE: Preg Test, Ur: NEGATIVE

## 2022-01-03 LAB — POC SARS CORONAVIRUS 2 AG: SARSCOV2ONAVIRUS 2 AG: NEGATIVE

## 2022-01-03 NOTE — ED Provider Notes (Signed)
Behavioral Health Admission [facility])  Date: 01/01/22 Patient Name: CALYSSA ZOBRIST MRN: [id number] Chief Complaint:  Chief Complaint  Patient presents with   Suicidal      Diagnoses:  Final diagnoses:  Alcohol use disorder, severe, dependence (HCC)  Major depressive disorder, recurrent episode, moderate (HCC)    HPI: Birtie Fellman. Sauve is a single 26 y/o female with a history of  depression and anxiety, alcohol abuse presenting to [facility] voluntarily with her friend [place]. Patient lives alone and works at a [employer]. Patient reports that she wants help with her alcohol use because she has been doing things she does not like. Patient reports that she has been lying and stealing from friends, family members and stores. Patient also endorses suicidal thoughts but without a plan or attempt. Patient endorses a history of SIB in high school but none since then. Patient reports that she has been drinking about 2-3 bottles of wine daily and smoking 1 or 2 bowls of marijuana daily. Patient reports that recently one of her dogs died and she broke up with her boyfriend. Patient reports that her longest sobriety has been three months in the fall of 2022. Patient receives outpatient mental health services from [facility] with Verlon Au who has prescribed patient prozac 60 mg,  hydroxyzine 25 mg, naltrexone 50 mg. Pt is in agreement to come inpatient to [facility].   PHQ 2-9:  Flowsheet Row ED from 11/14/2021 in [facility]  Thoughts that you would be better off dead, or of hurting yourself in some way Not at all  PHQ-9 Total Score 9       Flowsheet Row ED from 01/01/2022 in [facility] Most recent reading at 01/02/2022  2:02 AM Ad[facility] (Dis[facility] Most recent reading at 06/12/2021  8:00 AM ED from 06/12/2021 in [facility] Most recent reading  at 06/12/2021  2:18 AM  C-SSRS RISK CATEGORY Error: Question 6 not populated No Risk No Risk        Total Time spent with patient: 45 minutes  Musculoskeletal  Strength & Muscle Tone: within normal limits Gait & Station: normal Patient leans: N/A  Psychiatric Specialty Exam  Presentation General Appearance: Appropriate for Environment; Casual  Eye Contact:Good  Speech:Normal Rate  Speech Volume:Normal  Handedness:Right   Mood and Affect  Mood:Anxious; Depressed  Affect:Congruent   Thought Process  Thought Processes:Coherent  Descriptions of Associations:Intact  Orientation:Full (Time, Place and Person)  Thought Content:WDL  Diagnosis of Schizophrenia or Schizoaffective disorder in past: No   Hallucinations:No data recorded Ideas of Reference:None  Suicidal Thoughts:No data recorded Homicidal Thoughts:No data recorded  Sensorium  Memory:Immediate Good; Recent Good; Remote Good  Judgment:Good  Insight:Fair   Executive Functions  Concentration:Good  Attention Span:Good  Recall:Good  Fund of Knowledge:Good  Language:Good   Psychomotor Activity  Psychomotor Activity:No data recorded  Assets  Assets:Communication Skills; Desire for Improvement; Financial Resources/Insurance; Housing; Physical Health; Social Support   Sleep  Sleep:No data recorded  No data recorded  Physical Exam HENT:     Head: Normocephalic and atraumatic.     Nose: Nose normal.  Eyes:     Pupils: Pupils are equal, round, and reactive to light.  Cardiovascular:     Rate and Rhythm: Normal rate.  Pulmonary:     Effort: Pulmonary effort is normal.  Abdominal:     General: Abdomen is flat.  Musculoskeletal:  General: Normal range of motion.     Cervical back: Normal range of motion.  Skin:    General: Skin is warm.  Neurological:     General: No focal deficit present.     Mental Status: She is alert.  Psychiatric:        Attention and Perception:  Attention normal.        Mood and Affect: Mood is anxious and depressed.        Speech: Speech normal.        Behavior: Behavior normal.        Thought Content: Thought content normal.        Cognition and Memory: Cognition normal.        Judgment: Judgment normal.   Review of Systems  Constitutional: Negative.   HENT: Negative.    Eyes: Negative.   Respiratory: Negative.    Cardiovascular: Negative.   Gastrointestinal: Negative.   Genitourinary: Negative.   Musculoskeletal: Negative.   Skin: Negative.   Neurological: Negative.   Endo/Heme/Allergies: Negative.   Psychiatric/Behavioral: Negative.     Blood pressure (!) 141/94, pulse (!) 102, temperature 98.1 F (36.7 C), temperature source Oral, resp. rate 16, SpO2 96 %. There is no height or weight on file to calculate BMI.  Past Psychiatric History: Inpatient at [facility] July 2022  Is the patient at risk to self? No  Has the patient been a risk to self in the past 6 months? No .    Has the patient been a risk to self within the distant past? No   Is the patient a risk to others? No   Has the patient been a risk to others in the past 6 months? No   Has the patient been a risk to others within the distant past? No   Past Medical History:  Past Medical History:  Diagnosis Date   Fracture of elbow, medial condyle, left, closed 02/18/2015   medial epicondyle fx.    Past Surgical History:  Procedure Laterality Date   ORIF ELBOW FRACTURE Left 03/02/2015   Procedure: OPEN REDUCTION INTERNAL FIXATION (ORIF) ELBOW MEDIAL EPICONDYLE FRACTURE;  Surgeon: Jones Broom, MD;  Location: [facility];  Service: Orthopedics;  Laterality: Left;  Open reduction internal fixation left elbow medial epicondyle fracture   TONSILLECTOMY  10/30/2013    Family History: No family history on file.  Social History:  Social History   Socioeconomic History   Marital status: Single    Spouse name: Not on file   Number of children: Not  on file   Years of education: Not on file   Highest education level: Not on file  Occupational History   Not on file  Tobacco Use   Smoking status: Every Day    Packs/day: 0.50    Years: 3.00    Pack years: 1.50    Types: Cigarettes   Smokeless tobacco: Never  Substance and Sexual Activity   Alcohol use: Yes    Comment: occasionally   Drug use: No   Sexual activity: Not Currently    Birth control/protection: Pill  Other Topics Concern   Not on file  Social History Narrative   Not on file   Social Determinants of Health   Financial Resource Strain: Not on file  Food Insecurity: Not on file  Transportation Needs: Not on file  Physical Activity: Not on file  Stress: Not on file  Social Connections: Not on file  Intimate Partner Violence: Not on file  SDOH:  SDOH Screenings   Alcohol Screen: Not on file  Depression (PHQ2-9): Medium Risk   PHQ-2 Score: 9  Financial Resource Strain: Not on file  Food Insecurity: Not on file  Housing: Not on file  Physical Activity: Not on file  Social Connections: Not on file  Stress: Not on file  Tobacco Use: High Risk   Smoking Tobacco Use: Every Day   Smokeless Tobacco Use: Never   Passive Exposure: Not on file  Transportation Needs: Not on file    Last Labs:  Admission on 01/01/2022, Discharged on 01/02/2022  Component Date Value Ref Range Status   SARS Coronavirus 2 by RT PCR 01/02/2022 NEGATIVE  NEGATIVE Final   Comment: (NOTE) SARS-CoV-2 target nucleic acids are NOT DETECTED.  The SARS-CoV-2 RNA is generally detectable in upper respiratory specimens during the acute phase of infection. The lowest concentration of SARS-CoV-2 viral copies this assay can detect is 138 copies/mL. A negative result does not preclude SARS-Cov-2 infection and should not be used as the sole basis for treatment or other patient management decisions. A negative result may occur with  improper specimen collection/handling, submission of  specimen other than nasopharyngeal swab, presence of viral mutation(s) within the areas targeted by this assay, and inadequate number of viral copies(<138 copies/mL). A negative result must be combined with clinical observations, patient history, and epidemiological information. The expected result is Negative.  Fact Sheet for Patients:  BloggerCourse.comhttps://www.fda.gov/media/152166/download  Fact Sheet for Healthcare Providers:  SeriousBroker.ithttps://www.fda.gov/media/152162/download  This test is no                          t yet approved or cleared by the [place] FDA and  has been authorized for detection and/or diagnosis of SARS-CoV-2 by FDA under an Emergency Use Authorization (EUA). This EUA will remain  in effect (meaning this test can be used) for the duration of the COVID-19 declaration under Section 564(b)(1) of the Act, 21 U.S.C.section 360bbb-3(b)(1), unless the authorization is terminated  or revoked sooner.       Influenza A by PCR 01/02/2022 NEGATIVE  NEGATIVE Final   Influenza B by PCR 01/02/2022 NEGATIVE  NEGATIVE Final   Comment: (NOTE) The Xpert Xpress SARS-CoV-2/FLU/RSV plus assay is intended as an aid in the diagnosis of influenza from Nasopharyngeal swab specimens and should not be used as a sole basis for treatment. Nasal washings and aspirates are unacceptable for Xpert Xpress SARS-CoV-2/FLU/RSV testing.  Fact Sheet for Patients: BloggerCourse.comhttps://www.fda.gov/media/152166/download  Fact Sheet for Healthcare Providers: SeriousBroker.ithttps://www.fda.gov/media/152162/download  This test is not yet approved or cleared by the [place] FDA and has been authorized for detection and/or diagnosis of SARS-CoV-2 by FDA under an Emergency Use Authorization (EUA). This EUA will remain in effect (meaning this test can be used) for the duration of the COVID-19 declaration under Section 564(b)(1) of the Act, 21 U.S.C. section 360bbb-3(b)(1), unless the authorization is terminated  or revoked.  Performed at [facility], [address] [place], [place] [address]    WBC 01/02/2022 8.4  4.0 - 10.5 K/uL Final   RBC 01/02/2022 4.36  3.87 - 5.11 MIL/uL Final   Hemoglobin 01/02/2022 13.3  12.0 - 15.0 g/dL Final   HCT 64/40/347402/19/2023 39.3  36.0 - 46.0 % Final   MCV 01/02/2022 90.1  80.0 - 100.0 fL Final   MCH 01/02/2022 30.5  26.0 - 34.0 pg Final   MCHC 01/02/2022 33.8  30.0 - 36.0 g/dL Final  RDW 01/02/2022 12.7  11.5 - 15.5 % Final   Platelets 01/02/2022 301  150 - 400 K/uL Final   nRBC 01/02/2022 0.0  0.0 - 0.2 % Final   Neutrophils Relative % 01/02/2022 46  % Final   Neutro Abs 01/02/2022 3.9  1.7 - 7.7 K/uL Final   Lymphocytes Relative 01/02/2022 43  % Final   Lymphs Abs 01/02/2022 3.6  0.7 - 4.0 K/uL Final   Monocytes Relative 01/02/2022 8  % Final   Monocytes Absolute 01/02/2022 0.7  0.1 - 1.0 K/uL Final   Eosinophils Relative 01/02/2022 2  % Final   Eosinophils Absolute 01/02/2022 0.1  0.0 - 0.5 K/uL Final   Basophils Relative 01/02/2022 1  % Final   Basophils Absolute 01/02/2022 0.1  0.0 - 0.1 K/uL Final   Immature Granulocytes 01/02/2022 0  % Final   Abs Immature Granulocytes 01/02/2022 0.02  0.00 - 0.07 K/uL Final   Performed at [facility], [address], [place], [place] [address]   Sodium 01/02/2022 139  135 - 145 mmol/L Final   Potassium 01/02/2022 3.7  3.5 - 5.1 mmol/L Final   Chloride 01/02/2022 105  98 - 111 mmol/L Final   CO2 01/02/2022 23  22 - 32 mmol/L Final   Glucose, Bld 01/02/2022 77  70 - 99 mg/dL Final   Glucose reference range applies only to samples taken after fasting for at least 8 hours.   BUN 01/02/2022 7  6 - 20 mg/dL Final   Creatinine, Ser 01/02/2022 0.59  0.44 - 1.00 mg/dL Final   Calcium 62/69/4854 9.1  8.9 - 10.3 mg/dL Final   Total Protein 62/70/3500 8.0  6.5 - 8.1 g/dL Final   Albumin 93/81/8299 4.5  3.5 - 5.0 g/dL Final   AST 37/16/9678 41  15 - 41 U/L Final   ALT 01/02/2022 23  0 - 44 U/L Final   Alkaline  Phosphatase 01/02/2022 65  38 - 126 U/L Final   Total Bilirubin 01/02/2022 0.5  0.3 - 1.2 mg/dL Final   GFR, Estimated 01/02/2022 >60  >60 mL/min Final   Comment: (NOTE) Calculated using the CKD-EPI Creatinine Equation (2021)    Anion gap 01/02/2022 11  5 - 15 Final   Performed at [facility], [address], [place], [place] [address]   Hgb A1c MFr Bld 01/02/2022 4.6 (L)  4.8 - 5.6 % Final   Comment: (NOTE) Pre diabetes:          5.7%-6.4%  Diabetes:              >6.4%  Glycemic control for   <7.0% adults with diabetes    Mean Plasma Glucose 01/02/2022 85.32  mg/dL Final   Performed at [facility], [address], [place], [place] [address]   Alcohol, Ethyl (B) 01/02/2022 275 (H)  <10 mg/dL Final   Comment: (NOTE) Lowest detectable limit for serum alcohol is 10 mg/dL.  For medical purposes only. Performed at [facility], [address], [place], [place] [address]    Cholesterol 01/02/2022 270 (H)  0 - 200 mg/dL Final   Triglycerides 77/82/4235 386 (H)  <150 mg/dL Final   HDL 36/14/4315 80  >40 mg/dL Final   Total CHOL/HDL Ratio 01/02/2022 3.4  RATIO Final   VLDL 01/02/2022 77 (H)  0 - 40 mg/dL Final   LDL Cholesterol 01/02/2022 113 (H)  0 - 99 mg/dL Final   Comment:  Total Cholesterol/HDL:CHD Risk Coronary Heart Disease Risk Table                     Men   Women  1/2 Average Risk   3.4   3.3  Average Risk       5.0   4.4  2 X Average Risk   9.6   7.1  3 X Average Risk  23.4   11.0        Use the calculated Patient Ratio above and the CHD Risk Table to determine the patient's CHD Risk.        ATP III CLASSIFICATION (LDL):  <100     mg/dL   Optimal  [id number]  mg/dL   Near or Above                    Optimal  130-159  mg/dL   Borderline  [id number]  mg/dL   High  >811>190     mg/dL   Very High Performed at [facility], [address] [place], [place] [address]    TSH 01/02/2022 3.937  0.350 - 4.500 uIU/mL Final   Comment: Performed by a  3rd Generation assay with a functional sensitivity of <=0.01 uIU/mL. Performed at [facility], [address] [place], [place] [address]    SARS Coronavirus 2 Ag 01/02/2022 Negative  Negative Preliminary   POC Amphetamine UR 01/02/2022 None Detected  NONE DETECTED (Cut Off Level 1000 ng/mL) Final   POC Secobarbital (BAR) 01/02/2022 None Detected  NONE DETECTED (Cut Off Level 300 ng/mL) Final   POC Buprenorphine (BUP) 01/02/2022 None Detected  NONE DETECTED (Cut Off Level 10 ng/mL) Final   POC Oxazepam (BZO) 01/02/2022 None Detected  NONE DETECTED (Cut Off Level 300 ng/mL) Final   POC Cocaine UR 01/02/2022 None Detected  NONE DETECTED (Cut Off Level 300 ng/mL) Final   POC Methamphetamine UR 01/02/2022 None Detected  NONE DETECTED (Cut Off Level 1000 ng/mL) Final   POC Morphine 01/02/2022 None Detected  NONE DETECTED (Cut Off Level 300 ng/mL) Final   POC Oxycodone UR 01/02/2022 None Detected  NONE DETECTED (Cut Off Level 100 ng/mL) Final   POC Methadone UR 01/02/2022 None Detected  NONE DETECTED (Cut Off Level 300 ng/mL) Final   POC Marijuana UR 01/02/2022 Positive (A)  NONE DETECTED (Cut Off Level 50 ng/mL) Final   Preg Test, Ur 01/02/2022 NEGATIVE  NEGATIVE Final   Comment:        THE SENSITIVITY OF THIS METHODOLOGY IS >24 mIU/mL     Allergies: Patient has no known allergies.  PTA Medications: (Not in a hospital admission)   Medical Decision Making  Nabiha G. Jennette Kettleeal is a single 26 y/o female with a history of  depression and anxiety, alcohol abuse [facility]. [facility] meets the criteria for [facility] and will be admitted for safety and crisis stabilization.    Recommendations  Based on my evaluation the patient does not appear to have an emergency medical condition. [facility] for safety and crisis stabilization.    Jasper RilingShalon E Hilliary Jock, NP 01/03/22  6:50 AM

## 2022-05-09 DIAGNOSIS — F1220 Cannabis dependence, uncomplicated: Secondary | ICD-10-CM | POA: Diagnosis not present

## 2022-05-09 DIAGNOSIS — F1020 Alcohol dependence, uncomplicated: Secondary | ICD-10-CM | POA: Diagnosis not present

## 2022-05-15 DIAGNOSIS — F1020 Alcohol dependence, uncomplicated: Secondary | ICD-10-CM | POA: Diagnosis not present

## 2022-05-15 DIAGNOSIS — F1220 Cannabis dependence, uncomplicated: Secondary | ICD-10-CM | POA: Diagnosis not present

## 2022-05-16 DIAGNOSIS — F1020 Alcohol dependence, uncomplicated: Secondary | ICD-10-CM | POA: Diagnosis not present

## 2022-05-17 DIAGNOSIS — F1020 Alcohol dependence, uncomplicated: Secondary | ICD-10-CM | POA: Diagnosis not present

## 2022-05-18 DIAGNOSIS — F1020 Alcohol dependence, uncomplicated: Secondary | ICD-10-CM | POA: Diagnosis not present

## 2022-05-19 DIAGNOSIS — F1020 Alcohol dependence, uncomplicated: Secondary | ICD-10-CM | POA: Diagnosis not present

## 2022-05-20 DIAGNOSIS — F332 Major depressive disorder, recurrent severe without psychotic features: Secondary | ICD-10-CM | POA: Diagnosis not present

## 2022-05-20 DIAGNOSIS — F411 Generalized anxiety disorder: Secondary | ICD-10-CM | POA: Diagnosis not present

## 2022-05-20 DIAGNOSIS — F1020 Alcohol dependence, uncomplicated: Secondary | ICD-10-CM | POA: Diagnosis not present

## 2022-05-20 DIAGNOSIS — Z79899 Other long term (current) drug therapy: Secondary | ICD-10-CM | POA: Diagnosis not present

## 2022-05-20 DIAGNOSIS — F1220 Cannabis dependence, uncomplicated: Secondary | ICD-10-CM | POA: Diagnosis not present

## 2022-05-21 DIAGNOSIS — F1020 Alcohol dependence, uncomplicated: Secondary | ICD-10-CM | POA: Diagnosis not present

## 2022-05-23 DIAGNOSIS — F1020 Alcohol dependence, uncomplicated: Secondary | ICD-10-CM | POA: Diagnosis not present

## 2022-05-24 DIAGNOSIS — F1020 Alcohol dependence, uncomplicated: Secondary | ICD-10-CM | POA: Diagnosis not present

## 2022-05-25 DIAGNOSIS — F1020 Alcohol dependence, uncomplicated: Secondary | ICD-10-CM | POA: Diagnosis not present

## 2022-05-26 DIAGNOSIS — F1020 Alcohol dependence, uncomplicated: Secondary | ICD-10-CM | POA: Diagnosis not present

## 2022-05-27 DIAGNOSIS — F1020 Alcohol dependence, uncomplicated: Secondary | ICD-10-CM | POA: Diagnosis not present

## 2022-05-28 DIAGNOSIS — F1020 Alcohol dependence, uncomplicated: Secondary | ICD-10-CM | POA: Diagnosis not present

## 2022-05-30 DIAGNOSIS — F1020 Alcohol dependence, uncomplicated: Secondary | ICD-10-CM | POA: Diagnosis not present

## 2022-05-31 DIAGNOSIS — F1020 Alcohol dependence, uncomplicated: Secondary | ICD-10-CM | POA: Diagnosis not present

## 2022-06-01 DIAGNOSIS — F1020 Alcohol dependence, uncomplicated: Secondary | ICD-10-CM | POA: Diagnosis not present

## 2022-06-02 DIAGNOSIS — F1020 Alcohol dependence, uncomplicated: Secondary | ICD-10-CM | POA: Diagnosis not present

## 2022-06-03 DIAGNOSIS — F1020 Alcohol dependence, uncomplicated: Secondary | ICD-10-CM | POA: Diagnosis not present

## 2022-06-04 DIAGNOSIS — F1020 Alcohol dependence, uncomplicated: Secondary | ICD-10-CM | POA: Diagnosis not present

## 2022-06-06 DIAGNOSIS — Z79899 Other long term (current) drug therapy: Secondary | ICD-10-CM | POA: Diagnosis not present

## 2022-06-06 DIAGNOSIS — F1220 Cannabis dependence, uncomplicated: Secondary | ICD-10-CM | POA: Diagnosis not present

## 2022-06-06 DIAGNOSIS — F411 Generalized anxiety disorder: Secondary | ICD-10-CM | POA: Diagnosis not present

## 2022-06-06 DIAGNOSIS — F1020 Alcohol dependence, uncomplicated: Secondary | ICD-10-CM | POA: Diagnosis not present

## 2022-06-06 DIAGNOSIS — F332 Major depressive disorder, recurrent severe without psychotic features: Secondary | ICD-10-CM | POA: Diagnosis not present

## 2022-06-07 DIAGNOSIS — F1020 Alcohol dependence, uncomplicated: Secondary | ICD-10-CM | POA: Diagnosis not present

## 2022-06-08 DIAGNOSIS — F1020 Alcohol dependence, uncomplicated: Secondary | ICD-10-CM | POA: Diagnosis not present

## 2022-06-09 DIAGNOSIS — F411 Generalized anxiety disorder: Secondary | ICD-10-CM | POA: Diagnosis not present

## 2022-06-09 DIAGNOSIS — F1220 Cannabis dependence, uncomplicated: Secondary | ICD-10-CM | POA: Diagnosis not present

## 2022-06-09 DIAGNOSIS — F1020 Alcohol dependence, uncomplicated: Secondary | ICD-10-CM | POA: Diagnosis not present

## 2022-06-09 DIAGNOSIS — F332 Major depressive disorder, recurrent severe without psychotic features: Secondary | ICD-10-CM | POA: Diagnosis not present

## 2022-06-09 DIAGNOSIS — Z79899 Other long term (current) drug therapy: Secondary | ICD-10-CM | POA: Diagnosis not present

## 2022-06-10 DIAGNOSIS — F1020 Alcohol dependence, uncomplicated: Secondary | ICD-10-CM | POA: Diagnosis not present

## 2022-06-11 DIAGNOSIS — F1020 Alcohol dependence, uncomplicated: Secondary | ICD-10-CM | POA: Diagnosis not present

## 2022-06-13 DIAGNOSIS — F1020 Alcohol dependence, uncomplicated: Secondary | ICD-10-CM | POA: Diagnosis not present

## 2022-06-14 DIAGNOSIS — F1020 Alcohol dependence, uncomplicated: Secondary | ICD-10-CM | POA: Diagnosis not present

## 2022-06-15 DIAGNOSIS — F1020 Alcohol dependence, uncomplicated: Secondary | ICD-10-CM | POA: Diagnosis not present

## 2022-06-16 DIAGNOSIS — F1220 Cannabis dependence, uncomplicated: Secondary | ICD-10-CM | POA: Diagnosis not present

## 2022-06-16 DIAGNOSIS — F1020 Alcohol dependence, uncomplicated: Secondary | ICD-10-CM | POA: Diagnosis not present

## 2022-06-17 DIAGNOSIS — F1020 Alcohol dependence, uncomplicated: Secondary | ICD-10-CM | POA: Diagnosis not present

## 2022-06-17 DIAGNOSIS — F1220 Cannabis dependence, uncomplicated: Secondary | ICD-10-CM | POA: Diagnosis not present

## 2022-06-20 DIAGNOSIS — F1020 Alcohol dependence, uncomplicated: Secondary | ICD-10-CM | POA: Diagnosis not present

## 2022-06-20 DIAGNOSIS — F1220 Cannabis dependence, uncomplicated: Secondary | ICD-10-CM | POA: Diagnosis not present

## 2022-06-21 DIAGNOSIS — F4323 Adjustment disorder with mixed anxiety and depressed mood: Secondary | ICD-10-CM | POA: Diagnosis not present

## 2022-06-21 DIAGNOSIS — F1220 Cannabis dependence, uncomplicated: Secondary | ICD-10-CM | POA: Diagnosis not present

## 2022-06-21 DIAGNOSIS — F1020 Alcohol dependence, uncomplicated: Secondary | ICD-10-CM | POA: Diagnosis not present

## 2022-06-22 DIAGNOSIS — F1220 Cannabis dependence, uncomplicated: Secondary | ICD-10-CM | POA: Diagnosis not present

## 2022-06-22 DIAGNOSIS — F1020 Alcohol dependence, uncomplicated: Secondary | ICD-10-CM | POA: Diagnosis not present

## 2022-06-24 DIAGNOSIS — F1020 Alcohol dependence, uncomplicated: Secondary | ICD-10-CM | POA: Diagnosis not present

## 2022-06-24 DIAGNOSIS — F1220 Cannabis dependence, uncomplicated: Secondary | ICD-10-CM | POA: Diagnosis not present

## 2022-06-27 DIAGNOSIS — F1020 Alcohol dependence, uncomplicated: Secondary | ICD-10-CM | POA: Diagnosis not present

## 2022-06-27 DIAGNOSIS — F1220 Cannabis dependence, uncomplicated: Secondary | ICD-10-CM | POA: Diagnosis not present

## 2022-06-28 DIAGNOSIS — F1220 Cannabis dependence, uncomplicated: Secondary | ICD-10-CM | POA: Diagnosis not present

## 2022-06-28 DIAGNOSIS — F1020 Alcohol dependence, uncomplicated: Secondary | ICD-10-CM | POA: Diagnosis not present

## 2022-06-28 DIAGNOSIS — F4323 Adjustment disorder with mixed anxiety and depressed mood: Secondary | ICD-10-CM | POA: Diagnosis not present

## 2022-06-29 DIAGNOSIS — F1220 Cannabis dependence, uncomplicated: Secondary | ICD-10-CM | POA: Diagnosis not present

## 2022-06-29 DIAGNOSIS — F1020 Alcohol dependence, uncomplicated: Secondary | ICD-10-CM | POA: Diagnosis not present

## 2022-07-01 DIAGNOSIS — F1220 Cannabis dependence, uncomplicated: Secondary | ICD-10-CM | POA: Diagnosis not present

## 2022-07-01 DIAGNOSIS — F1020 Alcohol dependence, uncomplicated: Secondary | ICD-10-CM | POA: Diagnosis not present

## 2022-07-04 DIAGNOSIS — F1220 Cannabis dependence, uncomplicated: Secondary | ICD-10-CM | POA: Diagnosis not present

## 2022-07-04 DIAGNOSIS — F1020 Alcohol dependence, uncomplicated: Secondary | ICD-10-CM | POA: Diagnosis not present

## 2022-07-05 DIAGNOSIS — F4323 Adjustment disorder with mixed anxiety and depressed mood: Secondary | ICD-10-CM | POA: Diagnosis not present

## 2022-07-05 DIAGNOSIS — F1020 Alcohol dependence, uncomplicated: Secondary | ICD-10-CM | POA: Diagnosis not present

## 2022-07-05 DIAGNOSIS — F1220 Cannabis dependence, uncomplicated: Secondary | ICD-10-CM | POA: Diagnosis not present

## 2022-07-06 DIAGNOSIS — F1020 Alcohol dependence, uncomplicated: Secondary | ICD-10-CM | POA: Diagnosis not present

## 2022-07-06 DIAGNOSIS — F1220 Cannabis dependence, uncomplicated: Secondary | ICD-10-CM | POA: Diagnosis not present

## 2022-07-08 DIAGNOSIS — F1020 Alcohol dependence, uncomplicated: Secondary | ICD-10-CM | POA: Diagnosis not present

## 2022-07-08 DIAGNOSIS — F1220 Cannabis dependence, uncomplicated: Secondary | ICD-10-CM | POA: Diagnosis not present

## 2022-07-11 DIAGNOSIS — F411 Generalized anxiety disorder: Secondary | ICD-10-CM | POA: Diagnosis not present

## 2022-07-11 DIAGNOSIS — F1020 Alcohol dependence, uncomplicated: Secondary | ICD-10-CM | POA: Diagnosis not present

## 2022-07-11 DIAGNOSIS — F1220 Cannabis dependence, uncomplicated: Secondary | ICD-10-CM | POA: Diagnosis not present

## 2022-07-11 DIAGNOSIS — F1029 Alcohol dependence with unspecified alcohol-induced disorder: Secondary | ICD-10-CM | POA: Diagnosis not present

## 2022-07-11 DIAGNOSIS — F331 Major depressive disorder, recurrent, moderate: Secondary | ICD-10-CM | POA: Diagnosis not present

## 2022-07-12 DIAGNOSIS — F1220 Cannabis dependence, uncomplicated: Secondary | ICD-10-CM | POA: Diagnosis not present

## 2022-07-12 DIAGNOSIS — F1020 Alcohol dependence, uncomplicated: Secondary | ICD-10-CM | POA: Diagnosis not present

## 2022-07-12 DIAGNOSIS — F4323 Adjustment disorder with mixed anxiety and depressed mood: Secondary | ICD-10-CM | POA: Diagnosis not present

## 2022-07-13 DIAGNOSIS — F1020 Alcohol dependence, uncomplicated: Secondary | ICD-10-CM | POA: Diagnosis not present

## 2022-07-13 DIAGNOSIS — F1220 Cannabis dependence, uncomplicated: Secondary | ICD-10-CM | POA: Diagnosis not present

## 2022-07-15 DIAGNOSIS — F1220 Cannabis dependence, uncomplicated: Secondary | ICD-10-CM | POA: Diagnosis not present

## 2022-07-15 DIAGNOSIS — F1020 Alcohol dependence, uncomplicated: Secondary | ICD-10-CM | POA: Diagnosis not present

## 2022-07-25 DIAGNOSIS — F1029 Alcohol dependence with unspecified alcohol-induced disorder: Secondary | ICD-10-CM | POA: Diagnosis not present

## 2022-07-25 DIAGNOSIS — F411 Generalized anxiety disorder: Secondary | ICD-10-CM | POA: Diagnosis not present

## 2022-07-25 DIAGNOSIS — F331 Major depressive disorder, recurrent, moderate: Secondary | ICD-10-CM | POA: Diagnosis not present

## 2022-07-26 DIAGNOSIS — F4323 Adjustment disorder with mixed anxiety and depressed mood: Secondary | ICD-10-CM | POA: Diagnosis not present

## 2022-07-27 DIAGNOSIS — F1220 Cannabis dependence, uncomplicated: Secondary | ICD-10-CM | POA: Diagnosis not present

## 2022-07-27 DIAGNOSIS — F1020 Alcohol dependence, uncomplicated: Secondary | ICD-10-CM | POA: Diagnosis not present

## 2022-07-29 DIAGNOSIS — F1220 Cannabis dependence, uncomplicated: Secondary | ICD-10-CM | POA: Diagnosis not present

## 2022-07-29 DIAGNOSIS — F1020 Alcohol dependence, uncomplicated: Secondary | ICD-10-CM | POA: Diagnosis not present

## 2022-08-01 DIAGNOSIS — F1220 Cannabis dependence, uncomplicated: Secondary | ICD-10-CM | POA: Diagnosis not present

## 2022-08-01 DIAGNOSIS — F1020 Alcohol dependence, uncomplicated: Secondary | ICD-10-CM | POA: Diagnosis not present

## 2022-08-02 DIAGNOSIS — F4323 Adjustment disorder with mixed anxiety and depressed mood: Secondary | ICD-10-CM | POA: Diagnosis not present

## 2022-08-03 DIAGNOSIS — F1020 Alcohol dependence, uncomplicated: Secondary | ICD-10-CM | POA: Diagnosis not present

## 2022-08-03 DIAGNOSIS — F1220 Cannabis dependence, uncomplicated: Secondary | ICD-10-CM | POA: Diagnosis not present

## 2022-08-05 DIAGNOSIS — F1020 Alcohol dependence, uncomplicated: Secondary | ICD-10-CM | POA: Diagnosis not present

## 2022-08-05 DIAGNOSIS — F1220 Cannabis dependence, uncomplicated: Secondary | ICD-10-CM | POA: Diagnosis not present

## 2022-08-08 DIAGNOSIS — F1020 Alcohol dependence, uncomplicated: Secondary | ICD-10-CM | POA: Diagnosis not present

## 2022-08-08 DIAGNOSIS — F1220 Cannabis dependence, uncomplicated: Secondary | ICD-10-CM | POA: Diagnosis not present

## 2022-08-09 DIAGNOSIS — F4323 Adjustment disorder with mixed anxiety and depressed mood: Secondary | ICD-10-CM | POA: Diagnosis not present

## 2022-08-09 DIAGNOSIS — F1020 Alcohol dependence, uncomplicated: Secondary | ICD-10-CM | POA: Diagnosis not present

## 2022-08-09 DIAGNOSIS — F1220 Cannabis dependence, uncomplicated: Secondary | ICD-10-CM | POA: Diagnosis not present

## 2022-08-10 DIAGNOSIS — F1220 Cannabis dependence, uncomplicated: Secondary | ICD-10-CM | POA: Diagnosis not present

## 2022-08-10 DIAGNOSIS — F1020 Alcohol dependence, uncomplicated: Secondary | ICD-10-CM | POA: Diagnosis not present

## 2022-08-12 DIAGNOSIS — F1020 Alcohol dependence, uncomplicated: Secondary | ICD-10-CM | POA: Diagnosis not present

## 2022-08-12 DIAGNOSIS — F1220 Cannabis dependence, uncomplicated: Secondary | ICD-10-CM | POA: Diagnosis not present

## 2022-08-16 DIAGNOSIS — F4323 Adjustment disorder with mixed anxiety and depressed mood: Secondary | ICD-10-CM | POA: Diagnosis not present

## 2022-08-16 DIAGNOSIS — F1220 Cannabis dependence, uncomplicated: Secondary | ICD-10-CM | POA: Diagnosis not present

## 2022-08-16 DIAGNOSIS — F1020 Alcohol dependence, uncomplicated: Secondary | ICD-10-CM | POA: Diagnosis not present

## 2022-08-23 DIAGNOSIS — F1220 Cannabis dependence, uncomplicated: Secondary | ICD-10-CM | POA: Diagnosis not present

## 2022-08-23 DIAGNOSIS — F4323 Adjustment disorder with mixed anxiety and depressed mood: Secondary | ICD-10-CM | POA: Diagnosis not present

## 2022-08-23 DIAGNOSIS — F1020 Alcohol dependence, uncomplicated: Secondary | ICD-10-CM | POA: Diagnosis not present

## 2022-09-13 DIAGNOSIS — F4323 Adjustment disorder with mixed anxiety and depressed mood: Secondary | ICD-10-CM | POA: Diagnosis not present

## 2022-10-18 DIAGNOSIS — F1029 Alcohol dependence with unspecified alcohol-induced disorder: Secondary | ICD-10-CM | POA: Diagnosis not present

## 2022-10-18 DIAGNOSIS — F331 Major depressive disorder, recurrent, moderate: Secondary | ICD-10-CM | POA: Diagnosis not present

## 2022-10-18 DIAGNOSIS — F411 Generalized anxiety disorder: Secondary | ICD-10-CM | POA: Diagnosis not present

## 2022-12-14 DIAGNOSIS — F1029 Alcohol dependence with unspecified alcohol-induced disorder: Secondary | ICD-10-CM | POA: Diagnosis not present

## 2022-12-14 DIAGNOSIS — F331 Major depressive disorder, recurrent, moderate: Secondary | ICD-10-CM | POA: Diagnosis not present

## 2022-12-14 DIAGNOSIS — F411 Generalized anxiety disorder: Secondary | ICD-10-CM | POA: Diagnosis not present

## 2022-12-19 DIAGNOSIS — F1020 Alcohol dependence, uncomplicated: Secondary | ICD-10-CM | POA: Diagnosis not present

## 2022-12-26 DIAGNOSIS — F1020 Alcohol dependence, uncomplicated: Secondary | ICD-10-CM | POA: Diagnosis not present

## 2022-12-28 DIAGNOSIS — F1220 Cannabis dependence, uncomplicated: Secondary | ICD-10-CM | POA: Diagnosis not present

## 2022-12-28 DIAGNOSIS — F1020 Alcohol dependence, uncomplicated: Secondary | ICD-10-CM | POA: Diagnosis not present

## 2023-01-03 DIAGNOSIS — K130 Diseases of lips: Secondary | ICD-10-CM | POA: Diagnosis not present

## 2023-01-09 DIAGNOSIS — F1020 Alcohol dependence, uncomplicated: Secondary | ICD-10-CM | POA: Diagnosis not present

## 2023-01-10 DIAGNOSIS — F1020 Alcohol dependence, uncomplicated: Secondary | ICD-10-CM | POA: Diagnosis not present

## 2023-01-23 DIAGNOSIS — F1220 Cannabis dependence, uncomplicated: Secondary | ICD-10-CM | POA: Diagnosis not present

## 2023-01-23 DIAGNOSIS — F1020 Alcohol dependence, uncomplicated: Secondary | ICD-10-CM | POA: Diagnosis not present

## 2023-02-07 DIAGNOSIS — F331 Major depressive disorder, recurrent, moderate: Secondary | ICD-10-CM | POA: Diagnosis not present

## 2023-02-07 DIAGNOSIS — F411 Generalized anxiety disorder: Secondary | ICD-10-CM | POA: Diagnosis not present

## 2023-02-21 DIAGNOSIS — L710 Perioral dermatitis: Secondary | ICD-10-CM | POA: Diagnosis not present

## 2023-03-17 DIAGNOSIS — R87619 Unspecified abnormal cytological findings in specimens from cervix uteri: Secondary | ICD-10-CM | POA: Diagnosis not present

## 2023-03-17 DIAGNOSIS — E039 Hypothyroidism, unspecified: Secondary | ICD-10-CM | POA: Diagnosis not present

## 2023-03-17 DIAGNOSIS — F411 Generalized anxiety disorder: Secondary | ICD-10-CM | POA: Diagnosis not present

## 2023-03-17 DIAGNOSIS — Z113 Encounter for screening for infections with a predominantly sexual mode of transmission: Secondary | ICD-10-CM | POA: Diagnosis not present

## 2023-03-17 DIAGNOSIS — Z0000 Encounter for general adult medical examination without abnormal findings: Secondary | ICD-10-CM | POA: Diagnosis not present

## 2023-03-17 DIAGNOSIS — Z87891 Personal history of nicotine dependence: Secondary | ICD-10-CM | POA: Diagnosis not present

## 2023-03-17 DIAGNOSIS — Z789 Other specified health status: Secondary | ICD-10-CM | POA: Diagnosis not present

## 2023-03-17 DIAGNOSIS — Z6831 Body mass index (BMI) 31.0-31.9, adult: Secondary | ICD-10-CM | POA: Diagnosis not present

## 2023-03-17 DIAGNOSIS — Z13220 Encounter for screening for lipoid disorders: Secondary | ICD-10-CM | POA: Diagnosis not present

## 2023-04-24 DIAGNOSIS — F1029 Alcohol dependence with unspecified alcohol-induced disorder: Secondary | ICD-10-CM | POA: Diagnosis not present

## 2023-04-24 DIAGNOSIS — F411 Generalized anxiety disorder: Secondary | ICD-10-CM | POA: Diagnosis not present

## 2023-04-24 DIAGNOSIS — F331 Major depressive disorder, recurrent, moderate: Secondary | ICD-10-CM | POA: Diagnosis not present

## 2023-04-25 DIAGNOSIS — F1210 Cannabis abuse, uncomplicated: Secondary | ICD-10-CM | POA: Diagnosis not present

## 2023-04-25 DIAGNOSIS — F411 Generalized anxiety disorder: Secondary | ICD-10-CM | POA: Diagnosis not present

## 2023-04-25 DIAGNOSIS — F1020 Alcohol dependence, uncomplicated: Secondary | ICD-10-CM | POA: Diagnosis not present

## 2023-05-01 DIAGNOSIS — F411 Generalized anxiety disorder: Secondary | ICD-10-CM | POA: Diagnosis not present

## 2023-05-01 DIAGNOSIS — F1020 Alcohol dependence, uncomplicated: Secondary | ICD-10-CM | POA: Diagnosis not present

## 2023-05-02 DIAGNOSIS — F1220 Cannabis dependence, uncomplicated: Secondary | ICD-10-CM | POA: Diagnosis not present

## 2023-05-02 DIAGNOSIS — F5101 Primary insomnia: Secondary | ICD-10-CM | POA: Diagnosis not present

## 2023-05-02 DIAGNOSIS — F1020 Alcohol dependence, uncomplicated: Secondary | ICD-10-CM | POA: Diagnosis not present

## 2023-05-02 DIAGNOSIS — F332 Major depressive disorder, recurrent severe without psychotic features: Secondary | ICD-10-CM | POA: Diagnosis not present

## 2023-05-02 DIAGNOSIS — F411 Generalized anxiety disorder: Secondary | ICD-10-CM | POA: Diagnosis not present

## 2023-05-03 DIAGNOSIS — F1220 Cannabis dependence, uncomplicated: Secondary | ICD-10-CM | POA: Diagnosis not present

## 2023-05-03 DIAGNOSIS — F1020 Alcohol dependence, uncomplicated: Secondary | ICD-10-CM | POA: Diagnosis not present

## 2023-05-03 DIAGNOSIS — F5101 Primary insomnia: Secondary | ICD-10-CM | POA: Diagnosis not present

## 2023-05-03 DIAGNOSIS — F332 Major depressive disorder, recurrent severe without psychotic features: Secondary | ICD-10-CM | POA: Diagnosis not present

## 2023-05-03 DIAGNOSIS — F411 Generalized anxiety disorder: Secondary | ICD-10-CM | POA: Diagnosis not present

## 2023-05-04 DIAGNOSIS — F411 Generalized anxiety disorder: Secondary | ICD-10-CM | POA: Diagnosis not present

## 2023-05-04 DIAGNOSIS — F1020 Alcohol dependence, uncomplicated: Secondary | ICD-10-CM | POA: Diagnosis not present

## 2023-05-04 DIAGNOSIS — F1220 Cannabis dependence, uncomplicated: Secondary | ICD-10-CM | POA: Diagnosis not present

## 2023-05-04 DIAGNOSIS — F332 Major depressive disorder, recurrent severe without psychotic features: Secondary | ICD-10-CM | POA: Diagnosis not present

## 2023-05-04 DIAGNOSIS — F5101 Primary insomnia: Secondary | ICD-10-CM | POA: Diagnosis not present

## 2023-05-05 DIAGNOSIS — F5101 Primary insomnia: Secondary | ICD-10-CM | POA: Diagnosis not present

## 2023-05-05 DIAGNOSIS — F332 Major depressive disorder, recurrent severe without psychotic features: Secondary | ICD-10-CM | POA: Diagnosis not present

## 2023-05-05 DIAGNOSIS — F1020 Alcohol dependence, uncomplicated: Secondary | ICD-10-CM | POA: Diagnosis not present

## 2023-05-05 DIAGNOSIS — F1220 Cannabis dependence, uncomplicated: Secondary | ICD-10-CM | POA: Diagnosis not present

## 2023-05-05 DIAGNOSIS — F411 Generalized anxiety disorder: Secondary | ICD-10-CM | POA: Diagnosis not present

## 2023-05-06 DIAGNOSIS — F411 Generalized anxiety disorder: Secondary | ICD-10-CM | POA: Diagnosis not present

## 2023-05-06 DIAGNOSIS — F332 Major depressive disorder, recurrent severe without psychotic features: Secondary | ICD-10-CM | POA: Diagnosis not present

## 2023-05-06 DIAGNOSIS — F5101 Primary insomnia: Secondary | ICD-10-CM | POA: Diagnosis not present

## 2023-05-06 DIAGNOSIS — F1220 Cannabis dependence, uncomplicated: Secondary | ICD-10-CM | POA: Diagnosis not present

## 2023-05-06 DIAGNOSIS — F1020 Alcohol dependence, uncomplicated: Secondary | ICD-10-CM | POA: Diagnosis not present

## 2023-05-08 DIAGNOSIS — F332 Major depressive disorder, recurrent severe without psychotic features: Secondary | ICD-10-CM | POA: Diagnosis not present

## 2023-05-08 DIAGNOSIS — F1220 Cannabis dependence, uncomplicated: Secondary | ICD-10-CM | POA: Diagnosis not present

## 2023-05-08 DIAGNOSIS — F411 Generalized anxiety disorder: Secondary | ICD-10-CM | POA: Diagnosis not present

## 2023-05-08 DIAGNOSIS — F1020 Alcohol dependence, uncomplicated: Secondary | ICD-10-CM | POA: Diagnosis not present

## 2023-05-08 DIAGNOSIS — F5101 Primary insomnia: Secondary | ICD-10-CM | POA: Diagnosis not present

## 2023-05-09 DIAGNOSIS — F5101 Primary insomnia: Secondary | ICD-10-CM | POA: Diagnosis not present

## 2023-05-09 DIAGNOSIS — F411 Generalized anxiety disorder: Secondary | ICD-10-CM | POA: Diagnosis not present

## 2023-05-09 DIAGNOSIS — F332 Major depressive disorder, recurrent severe without psychotic features: Secondary | ICD-10-CM | POA: Diagnosis not present

## 2023-05-09 DIAGNOSIS — F1020 Alcohol dependence, uncomplicated: Secondary | ICD-10-CM | POA: Diagnosis not present

## 2023-05-09 DIAGNOSIS — F1220 Cannabis dependence, uncomplicated: Secondary | ICD-10-CM | POA: Diagnosis not present

## 2023-05-10 DIAGNOSIS — F411 Generalized anxiety disorder: Secondary | ICD-10-CM | POA: Diagnosis not present

## 2023-05-10 DIAGNOSIS — F1220 Cannabis dependence, uncomplicated: Secondary | ICD-10-CM | POA: Diagnosis not present

## 2023-05-10 DIAGNOSIS — F332 Major depressive disorder, recurrent severe without psychotic features: Secondary | ICD-10-CM | POA: Diagnosis not present

## 2023-05-10 DIAGNOSIS — F1020 Alcohol dependence, uncomplicated: Secondary | ICD-10-CM | POA: Diagnosis not present

## 2023-05-10 DIAGNOSIS — F5101 Primary insomnia: Secondary | ICD-10-CM | POA: Diagnosis not present

## 2023-05-11 DIAGNOSIS — F332 Major depressive disorder, recurrent severe without psychotic features: Secondary | ICD-10-CM | POA: Diagnosis not present

## 2023-05-11 DIAGNOSIS — F1020 Alcohol dependence, uncomplicated: Secondary | ICD-10-CM | POA: Diagnosis not present

## 2023-05-11 DIAGNOSIS — F1220 Cannabis dependence, uncomplicated: Secondary | ICD-10-CM | POA: Diagnosis not present

## 2023-05-11 DIAGNOSIS — F5101 Primary insomnia: Secondary | ICD-10-CM | POA: Diagnosis not present

## 2023-05-11 DIAGNOSIS — F411 Generalized anxiety disorder: Secondary | ICD-10-CM | POA: Diagnosis not present

## 2023-05-12 DIAGNOSIS — F1220 Cannabis dependence, uncomplicated: Secondary | ICD-10-CM | POA: Diagnosis not present

## 2023-05-12 DIAGNOSIS — F1020 Alcohol dependence, uncomplicated: Secondary | ICD-10-CM | POA: Diagnosis not present

## 2023-05-12 DIAGNOSIS — F332 Major depressive disorder, recurrent severe without psychotic features: Secondary | ICD-10-CM | POA: Diagnosis not present

## 2023-05-12 DIAGNOSIS — F411 Generalized anxiety disorder: Secondary | ICD-10-CM | POA: Diagnosis not present

## 2023-05-12 DIAGNOSIS — F5101 Primary insomnia: Secondary | ICD-10-CM | POA: Diagnosis not present

## 2023-05-13 DIAGNOSIS — F1220 Cannabis dependence, uncomplicated: Secondary | ICD-10-CM | POA: Diagnosis not present

## 2023-05-13 DIAGNOSIS — F1020 Alcohol dependence, uncomplicated: Secondary | ICD-10-CM | POA: Diagnosis not present

## 2023-05-13 DIAGNOSIS — F5101 Primary insomnia: Secondary | ICD-10-CM | POA: Diagnosis not present

## 2023-05-13 DIAGNOSIS — F411 Generalized anxiety disorder: Secondary | ICD-10-CM | POA: Diagnosis not present

## 2023-05-13 DIAGNOSIS — F332 Major depressive disorder, recurrent severe without psychotic features: Secondary | ICD-10-CM | POA: Diagnosis not present

## 2023-05-15 DIAGNOSIS — F332 Major depressive disorder, recurrent severe without psychotic features: Secondary | ICD-10-CM | POA: Diagnosis not present

## 2023-05-15 DIAGNOSIS — F1020 Alcohol dependence, uncomplicated: Secondary | ICD-10-CM | POA: Diagnosis not present

## 2023-05-15 DIAGNOSIS — F5101 Primary insomnia: Secondary | ICD-10-CM | POA: Diagnosis not present

## 2023-05-15 DIAGNOSIS — F1220 Cannabis dependence, uncomplicated: Secondary | ICD-10-CM | POA: Diagnosis not present

## 2023-05-15 DIAGNOSIS — F411 Generalized anxiety disorder: Secondary | ICD-10-CM | POA: Diagnosis not present

## 2023-05-16 DIAGNOSIS — F5101 Primary insomnia: Secondary | ICD-10-CM | POA: Diagnosis not present

## 2023-05-16 DIAGNOSIS — F1020 Alcohol dependence, uncomplicated: Secondary | ICD-10-CM | POA: Diagnosis not present

## 2023-05-16 DIAGNOSIS — F411 Generalized anxiety disorder: Secondary | ICD-10-CM | POA: Diagnosis not present

## 2023-05-16 DIAGNOSIS — F332 Major depressive disorder, recurrent severe without psychotic features: Secondary | ICD-10-CM | POA: Diagnosis not present

## 2023-05-16 DIAGNOSIS — F1220 Cannabis dependence, uncomplicated: Secondary | ICD-10-CM | POA: Diagnosis not present

## 2023-05-17 DIAGNOSIS — F1020 Alcohol dependence, uncomplicated: Secondary | ICD-10-CM | POA: Diagnosis not present

## 2023-05-17 DIAGNOSIS — F5101 Primary insomnia: Secondary | ICD-10-CM | POA: Diagnosis not present

## 2023-05-17 DIAGNOSIS — F1220 Cannabis dependence, uncomplicated: Secondary | ICD-10-CM | POA: Diagnosis not present

## 2023-05-17 DIAGNOSIS — F332 Major depressive disorder, recurrent severe without psychotic features: Secondary | ICD-10-CM | POA: Diagnosis not present

## 2023-05-17 DIAGNOSIS — F411 Generalized anxiety disorder: Secondary | ICD-10-CM | POA: Diagnosis not present

## 2023-05-18 DIAGNOSIS — F411 Generalized anxiety disorder: Secondary | ICD-10-CM | POA: Diagnosis not present

## 2023-05-18 DIAGNOSIS — F1220 Cannabis dependence, uncomplicated: Secondary | ICD-10-CM | POA: Diagnosis not present

## 2023-05-18 DIAGNOSIS — F332 Major depressive disorder, recurrent severe without psychotic features: Secondary | ICD-10-CM | POA: Diagnosis not present

## 2023-05-18 DIAGNOSIS — F5101 Primary insomnia: Secondary | ICD-10-CM | POA: Diagnosis not present

## 2023-05-18 DIAGNOSIS — F1020 Alcohol dependence, uncomplicated: Secondary | ICD-10-CM | POA: Diagnosis not present

## 2023-05-19 DIAGNOSIS — F1020 Alcohol dependence, uncomplicated: Secondary | ICD-10-CM | POA: Diagnosis not present

## 2023-05-19 DIAGNOSIS — F1220 Cannabis dependence, uncomplicated: Secondary | ICD-10-CM | POA: Diagnosis not present

## 2023-05-19 DIAGNOSIS — F411 Generalized anxiety disorder: Secondary | ICD-10-CM | POA: Diagnosis not present

## 2023-05-19 DIAGNOSIS — F332 Major depressive disorder, recurrent severe without psychotic features: Secondary | ICD-10-CM | POA: Diagnosis not present

## 2023-05-19 DIAGNOSIS — F5101 Primary insomnia: Secondary | ICD-10-CM | POA: Diagnosis not present

## 2023-05-20 DIAGNOSIS — F1220 Cannabis dependence, uncomplicated: Secondary | ICD-10-CM | POA: Diagnosis not present

## 2023-05-20 DIAGNOSIS — F411 Generalized anxiety disorder: Secondary | ICD-10-CM | POA: Diagnosis not present

## 2023-05-20 DIAGNOSIS — F332 Major depressive disorder, recurrent severe without psychotic features: Secondary | ICD-10-CM | POA: Diagnosis not present

## 2023-05-20 DIAGNOSIS — F1020 Alcohol dependence, uncomplicated: Secondary | ICD-10-CM | POA: Diagnosis not present

## 2023-05-20 DIAGNOSIS — F5101 Primary insomnia: Secondary | ICD-10-CM | POA: Diagnosis not present

## 2023-05-22 DIAGNOSIS — F1220 Cannabis dependence, uncomplicated: Secondary | ICD-10-CM | POA: Diagnosis not present

## 2023-05-22 DIAGNOSIS — F332 Major depressive disorder, recurrent severe without psychotic features: Secondary | ICD-10-CM | POA: Diagnosis not present

## 2023-05-22 DIAGNOSIS — F1020 Alcohol dependence, uncomplicated: Secondary | ICD-10-CM | POA: Diagnosis not present

## 2023-05-22 DIAGNOSIS — F411 Generalized anxiety disorder: Secondary | ICD-10-CM | POA: Diagnosis not present

## 2023-05-22 DIAGNOSIS — F5101 Primary insomnia: Secondary | ICD-10-CM | POA: Diagnosis not present

## 2023-05-23 DIAGNOSIS — F411 Generalized anxiety disorder: Secondary | ICD-10-CM | POA: Diagnosis not present

## 2023-05-23 DIAGNOSIS — F1020 Alcohol dependence, uncomplicated: Secondary | ICD-10-CM | POA: Diagnosis not present

## 2023-05-23 DIAGNOSIS — F5101 Primary insomnia: Secondary | ICD-10-CM | POA: Diagnosis not present

## 2023-05-23 DIAGNOSIS — F1220 Cannabis dependence, uncomplicated: Secondary | ICD-10-CM | POA: Diagnosis not present

## 2023-05-23 DIAGNOSIS — F332 Major depressive disorder, recurrent severe without psychotic features: Secondary | ICD-10-CM | POA: Diagnosis not present

## 2023-05-24 DIAGNOSIS — F1220 Cannabis dependence, uncomplicated: Secondary | ICD-10-CM | POA: Diagnosis not present

## 2023-05-24 DIAGNOSIS — F411 Generalized anxiety disorder: Secondary | ICD-10-CM | POA: Diagnosis not present

## 2023-05-24 DIAGNOSIS — F5101 Primary insomnia: Secondary | ICD-10-CM | POA: Diagnosis not present

## 2023-05-24 DIAGNOSIS — F1020 Alcohol dependence, uncomplicated: Secondary | ICD-10-CM | POA: Diagnosis not present

## 2023-05-24 DIAGNOSIS — F332 Major depressive disorder, recurrent severe without psychotic features: Secondary | ICD-10-CM | POA: Diagnosis not present

## 2023-05-25 DIAGNOSIS — F411 Generalized anxiety disorder: Secondary | ICD-10-CM | POA: Diagnosis not present

## 2023-05-25 DIAGNOSIS — F5101 Primary insomnia: Secondary | ICD-10-CM | POA: Diagnosis not present

## 2023-05-25 DIAGNOSIS — F1220 Cannabis dependence, uncomplicated: Secondary | ICD-10-CM | POA: Diagnosis not present

## 2023-05-25 DIAGNOSIS — F1020 Alcohol dependence, uncomplicated: Secondary | ICD-10-CM | POA: Diagnosis not present

## 2023-05-25 DIAGNOSIS — F332 Major depressive disorder, recurrent severe without psychotic features: Secondary | ICD-10-CM | POA: Diagnosis not present

## 2023-05-26 DIAGNOSIS — F1020 Alcohol dependence, uncomplicated: Secondary | ICD-10-CM | POA: Diagnosis not present

## 2023-05-26 DIAGNOSIS — F1220 Cannabis dependence, uncomplicated: Secondary | ICD-10-CM | POA: Diagnosis not present

## 2023-05-26 DIAGNOSIS — F411 Generalized anxiety disorder: Secondary | ICD-10-CM | POA: Diagnosis not present

## 2023-05-26 DIAGNOSIS — F5101 Primary insomnia: Secondary | ICD-10-CM | POA: Diagnosis not present

## 2023-05-26 DIAGNOSIS — F332 Major depressive disorder, recurrent severe without psychotic features: Secondary | ICD-10-CM | POA: Diagnosis not present

## 2023-05-27 DIAGNOSIS — F332 Major depressive disorder, recurrent severe without psychotic features: Secondary | ICD-10-CM | POA: Diagnosis not present

## 2023-05-27 DIAGNOSIS — F1220 Cannabis dependence, uncomplicated: Secondary | ICD-10-CM | POA: Diagnosis not present

## 2023-05-27 DIAGNOSIS — F1020 Alcohol dependence, uncomplicated: Secondary | ICD-10-CM | POA: Diagnosis not present

## 2023-05-27 DIAGNOSIS — F5101 Primary insomnia: Secondary | ICD-10-CM | POA: Diagnosis not present

## 2023-05-27 DIAGNOSIS — F411 Generalized anxiety disorder: Secondary | ICD-10-CM | POA: Diagnosis not present

## 2023-05-29 DIAGNOSIS — F332 Major depressive disorder, recurrent severe without psychotic features: Secondary | ICD-10-CM | POA: Diagnosis not present

## 2023-05-29 DIAGNOSIS — F5101 Primary insomnia: Secondary | ICD-10-CM | POA: Diagnosis not present

## 2023-05-29 DIAGNOSIS — F1020 Alcohol dependence, uncomplicated: Secondary | ICD-10-CM | POA: Diagnosis not present

## 2023-05-29 DIAGNOSIS — F411 Generalized anxiety disorder: Secondary | ICD-10-CM | POA: Diagnosis not present

## 2023-05-29 DIAGNOSIS — F1220 Cannabis dependence, uncomplicated: Secondary | ICD-10-CM | POA: Diagnosis not present

## 2023-05-30 DIAGNOSIS — F1220 Cannabis dependence, uncomplicated: Secondary | ICD-10-CM | POA: Diagnosis not present

## 2023-05-30 DIAGNOSIS — F5101 Primary insomnia: Secondary | ICD-10-CM | POA: Diagnosis not present

## 2023-05-30 DIAGNOSIS — F332 Major depressive disorder, recurrent severe without psychotic features: Secondary | ICD-10-CM | POA: Diagnosis not present

## 2023-05-30 DIAGNOSIS — F1020 Alcohol dependence, uncomplicated: Secondary | ICD-10-CM | POA: Diagnosis not present

## 2023-05-30 DIAGNOSIS — F411 Generalized anxiety disorder: Secondary | ICD-10-CM | POA: Diagnosis not present

## 2023-05-31 DIAGNOSIS — F411 Generalized anxiety disorder: Secondary | ICD-10-CM | POA: Diagnosis not present

## 2023-05-31 DIAGNOSIS — F5101 Primary insomnia: Secondary | ICD-10-CM | POA: Diagnosis not present

## 2023-05-31 DIAGNOSIS — F332 Major depressive disorder, recurrent severe without psychotic features: Secondary | ICD-10-CM | POA: Diagnosis not present

## 2023-05-31 DIAGNOSIS — F1020 Alcohol dependence, uncomplicated: Secondary | ICD-10-CM | POA: Diagnosis not present

## 2023-05-31 DIAGNOSIS — F1220 Cannabis dependence, uncomplicated: Secondary | ICD-10-CM | POA: Diagnosis not present

## 2023-06-01 DIAGNOSIS — F1220 Cannabis dependence, uncomplicated: Secondary | ICD-10-CM | POA: Diagnosis not present

## 2023-06-01 DIAGNOSIS — F1020 Alcohol dependence, uncomplicated: Secondary | ICD-10-CM | POA: Diagnosis not present

## 2023-06-01 DIAGNOSIS — F332 Major depressive disorder, recurrent severe without psychotic features: Secondary | ICD-10-CM | POA: Diagnosis not present

## 2023-06-01 DIAGNOSIS — F411 Generalized anxiety disorder: Secondary | ICD-10-CM | POA: Diagnosis not present

## 2023-06-01 DIAGNOSIS — F5101 Primary insomnia: Secondary | ICD-10-CM | POA: Diagnosis not present

## 2023-06-02 DIAGNOSIS — F5101 Primary insomnia: Secondary | ICD-10-CM | POA: Diagnosis not present

## 2023-06-02 DIAGNOSIS — F332 Major depressive disorder, recurrent severe without psychotic features: Secondary | ICD-10-CM | POA: Diagnosis not present

## 2023-06-02 DIAGNOSIS — F1220 Cannabis dependence, uncomplicated: Secondary | ICD-10-CM | POA: Diagnosis not present

## 2023-06-02 DIAGNOSIS — F1020 Alcohol dependence, uncomplicated: Secondary | ICD-10-CM | POA: Diagnosis not present

## 2023-06-02 DIAGNOSIS — F411 Generalized anxiety disorder: Secondary | ICD-10-CM | POA: Diagnosis not present

## 2023-06-03 DIAGNOSIS — F1220 Cannabis dependence, uncomplicated: Secondary | ICD-10-CM | POA: Diagnosis not present

## 2023-06-03 DIAGNOSIS — F332 Major depressive disorder, recurrent severe without psychotic features: Secondary | ICD-10-CM | POA: Diagnosis not present

## 2023-06-03 DIAGNOSIS — F411 Generalized anxiety disorder: Secondary | ICD-10-CM | POA: Diagnosis not present

## 2023-06-03 DIAGNOSIS — F1020 Alcohol dependence, uncomplicated: Secondary | ICD-10-CM | POA: Diagnosis not present

## 2023-06-03 DIAGNOSIS — F5101 Primary insomnia: Secondary | ICD-10-CM | POA: Diagnosis not present

## 2023-06-05 DIAGNOSIS — F1220 Cannabis dependence, uncomplicated: Secondary | ICD-10-CM | POA: Diagnosis not present

## 2023-06-05 DIAGNOSIS — F411 Generalized anxiety disorder: Secondary | ICD-10-CM | POA: Diagnosis not present

## 2023-06-05 DIAGNOSIS — F1020 Alcohol dependence, uncomplicated: Secondary | ICD-10-CM | POA: Diagnosis not present

## 2023-06-05 DIAGNOSIS — F332 Major depressive disorder, recurrent severe without psychotic features: Secondary | ICD-10-CM | POA: Diagnosis not present

## 2023-06-05 DIAGNOSIS — F5101 Primary insomnia: Secondary | ICD-10-CM | POA: Diagnosis not present

## 2023-06-06 DIAGNOSIS — F332 Major depressive disorder, recurrent severe without psychotic features: Secondary | ICD-10-CM | POA: Diagnosis not present

## 2023-06-06 DIAGNOSIS — F1020 Alcohol dependence, uncomplicated: Secondary | ICD-10-CM | POA: Diagnosis not present

## 2023-06-06 DIAGNOSIS — F1220 Cannabis dependence, uncomplicated: Secondary | ICD-10-CM | POA: Diagnosis not present

## 2023-06-06 DIAGNOSIS — F5101 Primary insomnia: Secondary | ICD-10-CM | POA: Diagnosis not present

## 2023-06-06 DIAGNOSIS — F411 Generalized anxiety disorder: Secondary | ICD-10-CM | POA: Diagnosis not present

## 2023-06-07 DIAGNOSIS — F1220 Cannabis dependence, uncomplicated: Secondary | ICD-10-CM | POA: Diagnosis not present

## 2023-06-07 DIAGNOSIS — F1020 Alcohol dependence, uncomplicated: Secondary | ICD-10-CM | POA: Diagnosis not present

## 2023-06-07 DIAGNOSIS — F332 Major depressive disorder, recurrent severe without psychotic features: Secondary | ICD-10-CM | POA: Diagnosis not present

## 2023-06-07 DIAGNOSIS — F411 Generalized anxiety disorder: Secondary | ICD-10-CM | POA: Diagnosis not present

## 2023-06-07 DIAGNOSIS — F5101 Primary insomnia: Secondary | ICD-10-CM | POA: Diagnosis not present

## 2023-06-08 DIAGNOSIS — F411 Generalized anxiety disorder: Secondary | ICD-10-CM | POA: Diagnosis not present

## 2023-06-08 DIAGNOSIS — F5101 Primary insomnia: Secondary | ICD-10-CM | POA: Diagnosis not present

## 2023-06-08 DIAGNOSIS — F1220 Cannabis dependence, uncomplicated: Secondary | ICD-10-CM | POA: Diagnosis not present

## 2023-06-08 DIAGNOSIS — F1020 Alcohol dependence, uncomplicated: Secondary | ICD-10-CM | POA: Diagnosis not present

## 2023-06-08 DIAGNOSIS — F332 Major depressive disorder, recurrent severe without psychotic features: Secondary | ICD-10-CM | POA: Diagnosis not present

## 2023-06-09 DIAGNOSIS — F332 Major depressive disorder, recurrent severe without psychotic features: Secondary | ICD-10-CM | POA: Diagnosis not present

## 2023-06-09 DIAGNOSIS — F411 Generalized anxiety disorder: Secondary | ICD-10-CM | POA: Diagnosis not present

## 2023-06-09 DIAGNOSIS — F1020 Alcohol dependence, uncomplicated: Secondary | ICD-10-CM | POA: Diagnosis not present

## 2023-06-09 DIAGNOSIS — F1220 Cannabis dependence, uncomplicated: Secondary | ICD-10-CM | POA: Diagnosis not present

## 2023-06-09 DIAGNOSIS — F5101 Primary insomnia: Secondary | ICD-10-CM | POA: Diagnosis not present

## 2023-06-12 DIAGNOSIS — F1020 Alcohol dependence, uncomplicated: Secondary | ICD-10-CM | POA: Diagnosis not present

## 2023-06-12 DIAGNOSIS — F332 Major depressive disorder, recurrent severe without psychotic features: Secondary | ICD-10-CM | POA: Diagnosis not present

## 2023-06-12 DIAGNOSIS — F411 Generalized anxiety disorder: Secondary | ICD-10-CM | POA: Diagnosis not present

## 2023-06-12 DIAGNOSIS — F1220 Cannabis dependence, uncomplicated: Secondary | ICD-10-CM | POA: Diagnosis not present

## 2023-06-12 DIAGNOSIS — F5101 Primary insomnia: Secondary | ICD-10-CM | POA: Diagnosis not present

## 2023-06-13 DIAGNOSIS — F1020 Alcohol dependence, uncomplicated: Secondary | ICD-10-CM | POA: Diagnosis not present

## 2023-06-13 DIAGNOSIS — F411 Generalized anxiety disorder: Secondary | ICD-10-CM | POA: Diagnosis not present

## 2023-06-13 DIAGNOSIS — F5101 Primary insomnia: Secondary | ICD-10-CM | POA: Diagnosis not present

## 2023-06-13 DIAGNOSIS — F332 Major depressive disorder, recurrent severe without psychotic features: Secondary | ICD-10-CM | POA: Diagnosis not present

## 2023-06-13 DIAGNOSIS — F1220 Cannabis dependence, uncomplicated: Secondary | ICD-10-CM | POA: Diagnosis not present

## 2023-06-14 DIAGNOSIS — F1220 Cannabis dependence, uncomplicated: Secondary | ICD-10-CM | POA: Diagnosis not present

## 2023-06-14 DIAGNOSIS — F332 Major depressive disorder, recurrent severe without psychotic features: Secondary | ICD-10-CM | POA: Diagnosis not present

## 2023-06-14 DIAGNOSIS — F1020 Alcohol dependence, uncomplicated: Secondary | ICD-10-CM | POA: Diagnosis not present

## 2023-06-14 DIAGNOSIS — F411 Generalized anxiety disorder: Secondary | ICD-10-CM | POA: Diagnosis not present

## 2023-06-14 DIAGNOSIS — F5101 Primary insomnia: Secondary | ICD-10-CM | POA: Diagnosis not present

## 2023-06-15 DIAGNOSIS — F1220 Cannabis dependence, uncomplicated: Secondary | ICD-10-CM | POA: Diagnosis not present

## 2023-06-15 DIAGNOSIS — F1020 Alcohol dependence, uncomplicated: Secondary | ICD-10-CM | POA: Diagnosis not present

## 2023-06-15 DIAGNOSIS — F332 Major depressive disorder, recurrent severe without psychotic features: Secondary | ICD-10-CM | POA: Diagnosis not present

## 2023-06-15 DIAGNOSIS — F411 Generalized anxiety disorder: Secondary | ICD-10-CM | POA: Diagnosis not present

## 2023-06-15 DIAGNOSIS — F5101 Primary insomnia: Secondary | ICD-10-CM | POA: Diagnosis not present

## 2023-06-16 DIAGNOSIS — F1020 Alcohol dependence, uncomplicated: Secondary | ICD-10-CM | POA: Diagnosis not present

## 2023-06-16 DIAGNOSIS — F1220 Cannabis dependence, uncomplicated: Secondary | ICD-10-CM | POA: Diagnosis not present

## 2023-06-19 DIAGNOSIS — F1220 Cannabis dependence, uncomplicated: Secondary | ICD-10-CM | POA: Diagnosis not present

## 2023-06-19 DIAGNOSIS — F1020 Alcohol dependence, uncomplicated: Secondary | ICD-10-CM | POA: Diagnosis not present

## 2023-06-20 DIAGNOSIS — F1220 Cannabis dependence, uncomplicated: Secondary | ICD-10-CM | POA: Diagnosis not present

## 2023-06-20 DIAGNOSIS — F1020 Alcohol dependence, uncomplicated: Secondary | ICD-10-CM | POA: Diagnosis not present

## 2023-06-21 DIAGNOSIS — F1020 Alcohol dependence, uncomplicated: Secondary | ICD-10-CM | POA: Diagnosis not present

## 2023-06-21 DIAGNOSIS — F1220 Cannabis dependence, uncomplicated: Secondary | ICD-10-CM | POA: Diagnosis not present

## 2023-06-23 DIAGNOSIS — F1020 Alcohol dependence, uncomplicated: Secondary | ICD-10-CM | POA: Diagnosis not present

## 2023-06-23 DIAGNOSIS — F1220 Cannabis dependence, uncomplicated: Secondary | ICD-10-CM | POA: Diagnosis not present

## 2023-06-26 DIAGNOSIS — F1020 Alcohol dependence, uncomplicated: Secondary | ICD-10-CM | POA: Diagnosis not present

## 2023-06-26 DIAGNOSIS — F1220 Cannabis dependence, uncomplicated: Secondary | ICD-10-CM | POA: Diagnosis not present

## 2023-06-27 DIAGNOSIS — F1220 Cannabis dependence, uncomplicated: Secondary | ICD-10-CM | POA: Diagnosis not present

## 2023-06-27 DIAGNOSIS — F1020 Alcohol dependence, uncomplicated: Secondary | ICD-10-CM | POA: Diagnosis not present

## 2023-06-28 DIAGNOSIS — F419 Anxiety disorder, unspecified: Secondary | ICD-10-CM | POA: Diagnosis not present

## 2023-06-28 DIAGNOSIS — F1220 Cannabis dependence, uncomplicated: Secondary | ICD-10-CM | POA: Diagnosis not present

## 2023-06-28 DIAGNOSIS — F325 Major depressive disorder, single episode, in full remission: Secondary | ICD-10-CM | POA: Diagnosis not present

## 2023-06-28 DIAGNOSIS — F1020 Alcohol dependence, uncomplicated: Secondary | ICD-10-CM | POA: Diagnosis not present

## 2023-06-28 DIAGNOSIS — Z79899 Other long term (current) drug therapy: Secondary | ICD-10-CM | POA: Diagnosis not present

## 2023-06-29 DIAGNOSIS — F1020 Alcohol dependence, uncomplicated: Secondary | ICD-10-CM | POA: Diagnosis not present

## 2023-06-29 DIAGNOSIS — F1220 Cannabis dependence, uncomplicated: Secondary | ICD-10-CM | POA: Diagnosis not present

## 2023-06-30 DIAGNOSIS — F1220 Cannabis dependence, uncomplicated: Secondary | ICD-10-CM | POA: Diagnosis not present

## 2023-06-30 DIAGNOSIS — F1020 Alcohol dependence, uncomplicated: Secondary | ICD-10-CM | POA: Diagnosis not present

## 2023-07-03 DIAGNOSIS — F1220 Cannabis dependence, uncomplicated: Secondary | ICD-10-CM | POA: Diagnosis not present

## 2023-07-03 DIAGNOSIS — F1020 Alcohol dependence, uncomplicated: Secondary | ICD-10-CM | POA: Diagnosis not present

## 2023-07-04 DIAGNOSIS — F1220 Cannabis dependence, uncomplicated: Secondary | ICD-10-CM | POA: Diagnosis not present

## 2023-07-04 DIAGNOSIS — F1020 Alcohol dependence, uncomplicated: Secondary | ICD-10-CM | POA: Diagnosis not present

## 2023-07-05 DIAGNOSIS — F1220 Cannabis dependence, uncomplicated: Secondary | ICD-10-CM | POA: Diagnosis not present

## 2023-07-05 DIAGNOSIS — F1020 Alcohol dependence, uncomplicated: Secondary | ICD-10-CM | POA: Diagnosis not present

## 2023-07-06 DIAGNOSIS — F1020 Alcohol dependence, uncomplicated: Secondary | ICD-10-CM | POA: Diagnosis not present

## 2023-07-06 DIAGNOSIS — F1220 Cannabis dependence, uncomplicated: Secondary | ICD-10-CM | POA: Diagnosis not present

## 2023-07-07 DIAGNOSIS — F1020 Alcohol dependence, uncomplicated: Secondary | ICD-10-CM | POA: Diagnosis not present

## 2023-07-07 DIAGNOSIS — F1220 Cannabis dependence, uncomplicated: Secondary | ICD-10-CM | POA: Diagnosis not present

## 2023-07-10 DIAGNOSIS — F1020 Alcohol dependence, uncomplicated: Secondary | ICD-10-CM | POA: Diagnosis not present

## 2023-07-10 DIAGNOSIS — F1220 Cannabis dependence, uncomplicated: Secondary | ICD-10-CM | POA: Diagnosis not present

## 2023-07-11 DIAGNOSIS — F1020 Alcohol dependence, uncomplicated: Secondary | ICD-10-CM | POA: Diagnosis not present

## 2023-07-11 DIAGNOSIS — F1220 Cannabis dependence, uncomplicated: Secondary | ICD-10-CM | POA: Diagnosis not present

## 2023-07-12 DIAGNOSIS — F1220 Cannabis dependence, uncomplicated: Secondary | ICD-10-CM | POA: Diagnosis not present

## 2023-07-12 DIAGNOSIS — F1020 Alcohol dependence, uncomplicated: Secondary | ICD-10-CM | POA: Diagnosis not present

## 2023-07-14 DIAGNOSIS — F1220 Cannabis dependence, uncomplicated: Secondary | ICD-10-CM | POA: Diagnosis not present

## 2023-07-14 DIAGNOSIS — F1020 Alcohol dependence, uncomplicated: Secondary | ICD-10-CM | POA: Diagnosis not present

## 2023-07-17 DIAGNOSIS — F1020 Alcohol dependence, uncomplicated: Secondary | ICD-10-CM | POA: Diagnosis not present

## 2023-07-17 DIAGNOSIS — F1220 Cannabis dependence, uncomplicated: Secondary | ICD-10-CM | POA: Diagnosis not present

## 2023-07-18 DIAGNOSIS — F1020 Alcohol dependence, uncomplicated: Secondary | ICD-10-CM | POA: Diagnosis not present

## 2023-07-18 DIAGNOSIS — F1220 Cannabis dependence, uncomplicated: Secondary | ICD-10-CM | POA: Diagnosis not present

## 2023-07-19 DIAGNOSIS — F1220 Cannabis dependence, uncomplicated: Secondary | ICD-10-CM | POA: Diagnosis not present

## 2023-07-19 DIAGNOSIS — F1020 Alcohol dependence, uncomplicated: Secondary | ICD-10-CM | POA: Diagnosis not present

## 2023-07-21 DIAGNOSIS — F1220 Cannabis dependence, uncomplicated: Secondary | ICD-10-CM | POA: Diagnosis not present

## 2023-07-21 DIAGNOSIS — F1020 Alcohol dependence, uncomplicated: Secondary | ICD-10-CM | POA: Diagnosis not present

## 2023-07-24 DIAGNOSIS — F1020 Alcohol dependence, uncomplicated: Secondary | ICD-10-CM | POA: Diagnosis not present

## 2023-07-24 DIAGNOSIS — F1220 Cannabis dependence, uncomplicated: Secondary | ICD-10-CM | POA: Diagnosis not present

## 2023-07-26 DIAGNOSIS — F1020 Alcohol dependence, uncomplicated: Secondary | ICD-10-CM | POA: Diagnosis not present

## 2023-07-26 DIAGNOSIS — F1220 Cannabis dependence, uncomplicated: Secondary | ICD-10-CM | POA: Diagnosis not present

## 2023-07-28 DIAGNOSIS — F1020 Alcohol dependence, uncomplicated: Secondary | ICD-10-CM | POA: Diagnosis not present

## 2023-07-28 DIAGNOSIS — F1220 Cannabis dependence, uncomplicated: Secondary | ICD-10-CM | POA: Diagnosis not present

## 2023-07-31 DIAGNOSIS — F1220 Cannabis dependence, uncomplicated: Secondary | ICD-10-CM | POA: Diagnosis not present

## 2023-07-31 DIAGNOSIS — F1020 Alcohol dependence, uncomplicated: Secondary | ICD-10-CM | POA: Diagnosis not present

## 2023-08-01 DIAGNOSIS — F1020 Alcohol dependence, uncomplicated: Secondary | ICD-10-CM | POA: Diagnosis not present

## 2023-08-01 DIAGNOSIS — F1220 Cannabis dependence, uncomplicated: Secondary | ICD-10-CM | POA: Diagnosis not present

## 2023-08-02 DIAGNOSIS — F1220 Cannabis dependence, uncomplicated: Secondary | ICD-10-CM | POA: Diagnosis not present

## 2023-08-02 DIAGNOSIS — F1020 Alcohol dependence, uncomplicated: Secondary | ICD-10-CM | POA: Diagnosis not present

## 2023-08-04 DIAGNOSIS — F1220 Cannabis dependence, uncomplicated: Secondary | ICD-10-CM | POA: Diagnosis not present

## 2023-08-04 DIAGNOSIS — F1020 Alcohol dependence, uncomplicated: Secondary | ICD-10-CM | POA: Diagnosis not present

## 2023-08-07 DIAGNOSIS — F1220 Cannabis dependence, uncomplicated: Secondary | ICD-10-CM | POA: Diagnosis not present

## 2023-08-07 DIAGNOSIS — F1020 Alcohol dependence, uncomplicated: Secondary | ICD-10-CM | POA: Diagnosis not present

## 2023-08-09 DIAGNOSIS — F1220 Cannabis dependence, uncomplicated: Secondary | ICD-10-CM | POA: Diagnosis not present

## 2023-08-09 DIAGNOSIS — F1020 Alcohol dependence, uncomplicated: Secondary | ICD-10-CM | POA: Diagnosis not present

## 2023-08-10 DIAGNOSIS — F1220 Cannabis dependence, uncomplicated: Secondary | ICD-10-CM | POA: Diagnosis not present

## 2023-08-10 DIAGNOSIS — F1020 Alcohol dependence, uncomplicated: Secondary | ICD-10-CM | POA: Diagnosis not present

## 2023-08-14 DIAGNOSIS — F1020 Alcohol dependence, uncomplicated: Secondary | ICD-10-CM | POA: Diagnosis not present

## 2023-08-14 DIAGNOSIS — H16143 Punctate keratitis, bilateral: Secondary | ICD-10-CM | POA: Diagnosis not present

## 2023-08-14 DIAGNOSIS — F1220 Cannabis dependence, uncomplicated: Secondary | ICD-10-CM | POA: Diagnosis not present

## 2023-08-16 DIAGNOSIS — F1020 Alcohol dependence, uncomplicated: Secondary | ICD-10-CM | POA: Diagnosis not present

## 2023-08-16 DIAGNOSIS — F1220 Cannabis dependence, uncomplicated: Secondary | ICD-10-CM | POA: Diagnosis not present

## 2023-08-18 DIAGNOSIS — F1020 Alcohol dependence, uncomplicated: Secondary | ICD-10-CM | POA: Diagnosis not present

## 2023-08-18 DIAGNOSIS — F1220 Cannabis dependence, uncomplicated: Secondary | ICD-10-CM | POA: Diagnosis not present

## 2023-08-30 DIAGNOSIS — F1220 Cannabis dependence, uncomplicated: Secondary | ICD-10-CM | POA: Diagnosis not present

## 2023-08-30 DIAGNOSIS — F1020 Alcohol dependence, uncomplicated: Secondary | ICD-10-CM | POA: Diagnosis not present

## 2023-09-06 DIAGNOSIS — F1020 Alcohol dependence, uncomplicated: Secondary | ICD-10-CM | POA: Diagnosis not present

## 2023-09-06 DIAGNOSIS — F1220 Cannabis dependence, uncomplicated: Secondary | ICD-10-CM | POA: Diagnosis not present

## 2023-09-13 DIAGNOSIS — F1220 Cannabis dependence, uncomplicated: Secondary | ICD-10-CM | POA: Diagnosis not present

## 2023-09-13 DIAGNOSIS — F1020 Alcohol dependence, uncomplicated: Secondary | ICD-10-CM | POA: Diagnosis not present

## 2023-09-20 DIAGNOSIS — F1020 Alcohol dependence, uncomplicated: Secondary | ICD-10-CM | POA: Diagnosis not present

## 2023-09-20 DIAGNOSIS — F1220 Cannabis dependence, uncomplicated: Secondary | ICD-10-CM | POA: Diagnosis not present

## 2023-09-27 DIAGNOSIS — F1020 Alcohol dependence, uncomplicated: Secondary | ICD-10-CM | POA: Diagnosis not present

## 2023-09-27 DIAGNOSIS — H53143 Visual discomfort, bilateral: Secondary | ICD-10-CM | POA: Diagnosis not present

## 2023-09-27 DIAGNOSIS — F1220 Cannabis dependence, uncomplicated: Secondary | ICD-10-CM | POA: Diagnosis not present

## 2023-10-04 DIAGNOSIS — F1020 Alcohol dependence, uncomplicated: Secondary | ICD-10-CM | POA: Diagnosis not present

## 2023-10-04 DIAGNOSIS — F1220 Cannabis dependence, uncomplicated: Secondary | ICD-10-CM | POA: Diagnosis not present

## 2023-10-11 DIAGNOSIS — F1020 Alcohol dependence, uncomplicated: Secondary | ICD-10-CM | POA: Diagnosis not present

## 2023-10-11 DIAGNOSIS — F1220 Cannabis dependence, uncomplicated: Secondary | ICD-10-CM | POA: Diagnosis not present

## 2023-10-18 DIAGNOSIS — F1020 Alcohol dependence, uncomplicated: Secondary | ICD-10-CM | POA: Diagnosis not present

## 2023-10-18 DIAGNOSIS — F1220 Cannabis dependence, uncomplicated: Secondary | ICD-10-CM | POA: Diagnosis not present

## 2023-10-25 DIAGNOSIS — F1220 Cannabis dependence, uncomplicated: Secondary | ICD-10-CM | POA: Diagnosis not present

## 2023-10-25 DIAGNOSIS — F1020 Alcohol dependence, uncomplicated: Secondary | ICD-10-CM | POA: Diagnosis not present

## 2023-11-22 DIAGNOSIS — F1020 Alcohol dependence, uncomplicated: Secondary | ICD-10-CM | POA: Diagnosis not present

## 2023-11-22 DIAGNOSIS — F1220 Cannabis dependence, uncomplicated: Secondary | ICD-10-CM | POA: Diagnosis not present

## 2023-12-06 DIAGNOSIS — F1020 Alcohol dependence, uncomplicated: Secondary | ICD-10-CM | POA: Diagnosis not present

## 2023-12-06 DIAGNOSIS — F1220 Cannabis dependence, uncomplicated: Secondary | ICD-10-CM | POA: Diagnosis not present

## 2023-12-13 DIAGNOSIS — F1020 Alcohol dependence, uncomplicated: Secondary | ICD-10-CM | POA: Diagnosis not present

## 2023-12-13 DIAGNOSIS — F1220 Cannabis dependence, uncomplicated: Secondary | ICD-10-CM | POA: Diagnosis not present

## 2023-12-20 DIAGNOSIS — F1220 Cannabis dependence, uncomplicated: Secondary | ICD-10-CM | POA: Diagnosis not present

## 2023-12-20 DIAGNOSIS — F1020 Alcohol dependence, uncomplicated: Secondary | ICD-10-CM | POA: Diagnosis not present

## 2024-01-10 DIAGNOSIS — F1220 Cannabis dependence, uncomplicated: Secondary | ICD-10-CM | POA: Diagnosis not present

## 2024-01-10 DIAGNOSIS — F1020 Alcohol dependence, uncomplicated: Secondary | ICD-10-CM | POA: Diagnosis not present

## 2024-01-11 DIAGNOSIS — F1020 Alcohol dependence, uncomplicated: Secondary | ICD-10-CM | POA: Diagnosis not present

## 2024-01-11 DIAGNOSIS — F1220 Cannabis dependence, uncomplicated: Secondary | ICD-10-CM | POA: Diagnosis not present

## 2024-01-17 DIAGNOSIS — F1220 Cannabis dependence, uncomplicated: Secondary | ICD-10-CM | POA: Diagnosis not present

## 2024-01-17 DIAGNOSIS — F1020 Alcohol dependence, uncomplicated: Secondary | ICD-10-CM | POA: Diagnosis not present

## 2024-01-24 DIAGNOSIS — F1220 Cannabis dependence, uncomplicated: Secondary | ICD-10-CM | POA: Diagnosis not present

## 2024-01-24 DIAGNOSIS — F1020 Alcohol dependence, uncomplicated: Secondary | ICD-10-CM | POA: Diagnosis not present

## 2024-02-07 DIAGNOSIS — F1020 Alcohol dependence, uncomplicated: Secondary | ICD-10-CM | POA: Diagnosis not present

## 2024-02-07 DIAGNOSIS — F1220 Cannabis dependence, uncomplicated: Secondary | ICD-10-CM | POA: Diagnosis not present

## 2024-02-09 DIAGNOSIS — Z8261 Family history of arthritis: Secondary | ICD-10-CM | POA: Diagnosis not present

## 2024-02-09 DIAGNOSIS — R42 Dizziness and giddiness: Secondary | ICD-10-CM | POA: Diagnosis not present

## 2024-02-09 DIAGNOSIS — R748 Abnormal levels of other serum enzymes: Secondary | ICD-10-CM | POA: Diagnosis not present

## 2024-02-09 DIAGNOSIS — M2560 Stiffness of unspecified joint, not elsewhere classified: Secondary | ICD-10-CM | POA: Diagnosis not present

## 2024-02-09 DIAGNOSIS — Z3202 Encounter for pregnancy test, result negative: Secondary | ICD-10-CM | POA: Diagnosis not present

## 2024-02-14 DIAGNOSIS — F1020 Alcohol dependence, uncomplicated: Secondary | ICD-10-CM | POA: Diagnosis not present

## 2024-02-14 DIAGNOSIS — F1220 Cannabis dependence, uncomplicated: Secondary | ICD-10-CM | POA: Diagnosis not present

## 2024-03-13 DIAGNOSIS — F1020 Alcohol dependence, uncomplicated: Secondary | ICD-10-CM | POA: Diagnosis not present

## 2024-03-13 DIAGNOSIS — F1220 Cannabis dependence, uncomplicated: Secondary | ICD-10-CM | POA: Diagnosis not present

## 2024-04-02 DIAGNOSIS — F1020 Alcohol dependence, uncomplicated: Secondary | ICD-10-CM | POA: Diagnosis not present

## 2024-04-02 DIAGNOSIS — F1220 Cannabis dependence, uncomplicated: Secondary | ICD-10-CM | POA: Diagnosis not present

## 2024-04-10 DIAGNOSIS — F1220 Cannabis dependence, uncomplicated: Secondary | ICD-10-CM | POA: Diagnosis not present

## 2024-04-10 DIAGNOSIS — F1020 Alcohol dependence, uncomplicated: Secondary | ICD-10-CM | POA: Diagnosis not present

## 2024-04-17 DIAGNOSIS — F330 Major depressive disorder, recurrent, mild: Secondary | ICD-10-CM | POA: Diagnosis not present

## 2024-04-17 DIAGNOSIS — Z79899 Other long term (current) drug therapy: Secondary | ICD-10-CM | POA: Diagnosis not present

## 2024-04-17 DIAGNOSIS — E559 Vitamin D deficiency, unspecified: Secondary | ICD-10-CM | POA: Diagnosis not present

## 2024-04-17 DIAGNOSIS — F1220 Cannabis dependence, uncomplicated: Secondary | ICD-10-CM | POA: Diagnosis not present

## 2024-04-17 DIAGNOSIS — F1020 Alcohol dependence, uncomplicated: Secondary | ICD-10-CM | POA: Diagnosis not present

## 2024-05-14 DIAGNOSIS — F1020 Alcohol dependence, uncomplicated: Secondary | ICD-10-CM | POA: Diagnosis not present

## 2024-05-14 DIAGNOSIS — F1220 Cannabis dependence, uncomplicated: Secondary | ICD-10-CM | POA: Diagnosis not present

## 2024-05-29 DIAGNOSIS — F1220 Cannabis dependence, uncomplicated: Secondary | ICD-10-CM | POA: Diagnosis not present

## 2024-05-29 DIAGNOSIS — F1020 Alcohol dependence, uncomplicated: Secondary | ICD-10-CM | POA: Diagnosis not present

## 2024-06-10 DIAGNOSIS — E039 Hypothyroidism, unspecified: Secondary | ICD-10-CM | POA: Diagnosis not present

## 2024-06-10 DIAGNOSIS — Z0000 Encounter for general adult medical examination without abnormal findings: Secondary | ICD-10-CM | POA: Diagnosis not present

## 2024-06-12 DIAGNOSIS — F1220 Cannabis dependence, uncomplicated: Secondary | ICD-10-CM | POA: Diagnosis not present

## 2024-06-12 DIAGNOSIS — F1020 Alcohol dependence, uncomplicated: Secondary | ICD-10-CM | POA: Diagnosis not present

## 2024-06-26 DIAGNOSIS — F1220 Cannabis dependence, uncomplicated: Secondary | ICD-10-CM | POA: Diagnosis not present

## 2024-06-26 DIAGNOSIS — F1020 Alcohol dependence, uncomplicated: Secondary | ICD-10-CM | POA: Diagnosis not present

## 2024-07-10 DIAGNOSIS — F1020 Alcohol dependence, uncomplicated: Secondary | ICD-10-CM | POA: Diagnosis not present

## 2024-07-10 DIAGNOSIS — F1220 Cannabis dependence, uncomplicated: Secondary | ICD-10-CM | POA: Diagnosis not present

## 2024-07-17 DIAGNOSIS — Z01812 Encounter for preprocedural laboratory examination: Secondary | ICD-10-CM | POA: Diagnosis not present

## 2024-07-24 DIAGNOSIS — F1020 Alcohol dependence, uncomplicated: Secondary | ICD-10-CM | POA: Diagnosis not present

## 2024-07-24 DIAGNOSIS — F1220 Cannabis dependence, uncomplicated: Secondary | ICD-10-CM | POA: Diagnosis not present

## 2024-08-07 DIAGNOSIS — F1220 Cannabis dependence, uncomplicated: Secondary | ICD-10-CM | POA: Diagnosis not present

## 2024-08-07 DIAGNOSIS — F1020 Alcohol dependence, uncomplicated: Secondary | ICD-10-CM | POA: Diagnosis not present

## 2024-08-20 DIAGNOSIS — F1020 Alcohol dependence, uncomplicated: Secondary | ICD-10-CM | POA: Diagnosis not present

## 2024-08-20 DIAGNOSIS — F1220 Cannabis dependence, uncomplicated: Secondary | ICD-10-CM | POA: Diagnosis not present

## 2024-08-23 DIAGNOSIS — F1220 Cannabis dependence, uncomplicated: Secondary | ICD-10-CM | POA: Diagnosis not present

## 2024-08-23 DIAGNOSIS — F1020 Alcohol dependence, uncomplicated: Secondary | ICD-10-CM | POA: Diagnosis not present

## 2024-09-04 DIAGNOSIS — F1020 Alcohol dependence, uncomplicated: Secondary | ICD-10-CM | POA: Diagnosis not present

## 2024-09-04 DIAGNOSIS — F1220 Cannabis dependence, uncomplicated: Secondary | ICD-10-CM | POA: Diagnosis not present

## 2024-09-18 DIAGNOSIS — F1220 Cannabis dependence, uncomplicated: Secondary | ICD-10-CM | POA: Diagnosis not present

## 2024-09-18 DIAGNOSIS — L710 Perioral dermatitis: Secondary | ICD-10-CM | POA: Diagnosis not present

## 2024-09-18 DIAGNOSIS — F1020 Alcohol dependence, uncomplicated: Secondary | ICD-10-CM | POA: Diagnosis not present

## 2024-10-02 DIAGNOSIS — F1220 Cannabis dependence, uncomplicated: Secondary | ICD-10-CM | POA: Diagnosis not present

## 2024-10-02 DIAGNOSIS — F1020 Alcohol dependence, uncomplicated: Secondary | ICD-10-CM | POA: Diagnosis not present

## 2024-10-16 DIAGNOSIS — F1220 Cannabis dependence, uncomplicated: Secondary | ICD-10-CM | POA: Diagnosis not present

## 2024-10-16 DIAGNOSIS — F1020 Alcohol dependence, uncomplicated: Secondary | ICD-10-CM | POA: Diagnosis not present

## 2024-10-30 DIAGNOSIS — F1020 Alcohol dependence, uncomplicated: Secondary | ICD-10-CM | POA: Diagnosis not present

## 2024-10-30 DIAGNOSIS — F1220 Cannabis dependence, uncomplicated: Secondary | ICD-10-CM | POA: Diagnosis not present
